# Patient Record
Sex: Male | Born: 1950 | Race: White | Hispanic: No | Marital: Married | Smoking: Former smoker
Health system: Southern US, Community
[De-identification: ages and names within clinical notes are randomized; demographics above are authoritative.]

## PROBLEM LIST (undated history)

## (undated) DIAGNOSIS — J449 Chronic obstructive pulmonary disease, unspecified: Secondary | ICD-10-CM

## (undated) DIAGNOSIS — L409 Psoriasis, unspecified: Secondary | ICD-10-CM

## (undated) DIAGNOSIS — I509 Heart failure, unspecified: Secondary | ICD-10-CM

## (undated) HISTORY — PX: HERNIA REPAIR: SHX51

## (undated) HISTORY — PX: PACEMAKER IMPLANT: EP1218

## (undated) HISTORY — PX: EYE SURGERY: SHX253

---

## 2012-01-28 DIAGNOSIS — I428 Other cardiomyopathies: Secondary | ICD-10-CM | POA: Diagnosis not present

## 2012-01-28 DIAGNOSIS — Z95810 Presence of automatic (implantable) cardiac defibrillator: Secondary | ICD-10-CM | POA: Diagnosis not present

## 2012-04-06 DIAGNOSIS — M25569 Pain in unspecified knee: Secondary | ICD-10-CM | POA: Diagnosis not present

## 2012-04-06 DIAGNOSIS — M76899 Other specified enthesopathies of unspecified lower limb, excluding foot: Secondary | ICD-10-CM | POA: Diagnosis not present

## 2012-04-20 DIAGNOSIS — I509 Heart failure, unspecified: Secondary | ICD-10-CM | POA: Diagnosis not present

## 2012-04-20 DIAGNOSIS — I4729 Other ventricular tachycardia: Secondary | ICD-10-CM | POA: Diagnosis not present

## 2012-04-20 DIAGNOSIS — I428 Other cardiomyopathies: Secondary | ICD-10-CM | POA: Diagnosis not present

## 2012-04-20 DIAGNOSIS — I426 Alcoholic cardiomyopathy: Secondary | ICD-10-CM | POA: Diagnosis not present

## 2012-04-20 DIAGNOSIS — Z4502 Encounter for adjustment and management of automatic implantable cardiac defibrillator: Secondary | ICD-10-CM | POA: Diagnosis not present

## 2012-04-20 DIAGNOSIS — I472 Ventricular tachycardia, unspecified: Secondary | ICD-10-CM | POA: Diagnosis not present

## 2012-04-20 DIAGNOSIS — I442 Atrioventricular block, complete: Secondary | ICD-10-CM | POA: Diagnosis not present

## 2012-04-27 DIAGNOSIS — E785 Hyperlipidemia, unspecified: Secondary | ICD-10-CM | POA: Diagnosis not present

## 2012-04-27 DIAGNOSIS — M109 Gout, unspecified: Secondary | ICD-10-CM | POA: Diagnosis not present

## 2012-04-27 DIAGNOSIS — J449 Chronic obstructive pulmonary disease, unspecified: Secondary | ICD-10-CM | POA: Diagnosis not present

## 2012-04-27 DIAGNOSIS — I509 Heart failure, unspecified: Secondary | ICD-10-CM | POA: Diagnosis not present

## 2012-04-27 DIAGNOSIS — R7309 Other abnormal glucose: Secondary | ICD-10-CM | POA: Diagnosis not present

## 2012-04-27 DIAGNOSIS — I10 Essential (primary) hypertension: Secondary | ICD-10-CM | POA: Diagnosis not present

## 2012-04-27 DIAGNOSIS — Z125 Encounter for screening for malignant neoplasm of prostate: Secondary | ICD-10-CM | POA: Diagnosis not present

## 2012-05-11 DIAGNOSIS — J449 Chronic obstructive pulmonary disease, unspecified: Secondary | ICD-10-CM | POA: Diagnosis not present

## 2012-05-11 DIAGNOSIS — I426 Alcoholic cardiomyopathy: Secondary | ICD-10-CM | POA: Diagnosis not present

## 2012-05-11 DIAGNOSIS — R030 Elevated blood-pressure reading, without diagnosis of hypertension: Secondary | ICD-10-CM | POA: Diagnosis not present

## 2012-05-11 DIAGNOSIS — E782 Mixed hyperlipidemia: Secondary | ICD-10-CM | POA: Diagnosis not present

## 2012-05-11 DIAGNOSIS — M109 Gout, unspecified: Secondary | ICD-10-CM | POA: Diagnosis not present

## 2012-06-22 DIAGNOSIS — H9209 Otalgia, unspecified ear: Secondary | ICD-10-CM | POA: Diagnosis not present

## 2012-07-12 DIAGNOSIS — H6500 Acute serous otitis media, unspecified ear: Secondary | ICD-10-CM | POA: Diagnosis not present

## 2012-07-26 DIAGNOSIS — Z95810 Presence of automatic (implantable) cardiac defibrillator: Secondary | ICD-10-CM | POA: Diagnosis not present

## 2012-07-26 DIAGNOSIS — I428 Other cardiomyopathies: Secondary | ICD-10-CM | POA: Diagnosis not present

## 2012-10-20 DIAGNOSIS — M109 Gout, unspecified: Secondary | ICD-10-CM | POA: Diagnosis not present

## 2012-10-20 DIAGNOSIS — I472 Ventricular tachycardia: Secondary | ICD-10-CM | POA: Diagnosis not present

## 2012-10-20 DIAGNOSIS — N400 Enlarged prostate without lower urinary tract symptoms: Secondary | ICD-10-CM | POA: Diagnosis not present

## 2012-10-20 DIAGNOSIS — I509 Heart failure, unspecified: Secondary | ICD-10-CM | POA: Diagnosis not present

## 2012-10-20 DIAGNOSIS — R7309 Other abnormal glucose: Secondary | ICD-10-CM | POA: Diagnosis not present

## 2012-10-20 DIAGNOSIS — R7301 Impaired fasting glucose: Secondary | ICD-10-CM | POA: Diagnosis not present

## 2012-10-20 DIAGNOSIS — R030 Elevated blood-pressure reading, without diagnosis of hypertension: Secondary | ICD-10-CM | POA: Diagnosis not present

## 2012-10-20 DIAGNOSIS — E782 Mixed hyperlipidemia: Secondary | ICD-10-CM | POA: Diagnosis not present

## 2012-10-20 DIAGNOSIS — G4700 Insomnia, unspecified: Secondary | ICD-10-CM | POA: Diagnosis not present

## 2012-10-20 DIAGNOSIS — K5730 Diverticulosis of large intestine without perforation or abscess without bleeding: Secondary | ICD-10-CM | POA: Diagnosis not present

## 2012-10-20 DIAGNOSIS — I426 Alcoholic cardiomyopathy: Secondary | ICD-10-CM | POA: Diagnosis not present

## 2012-10-20 DIAGNOSIS — I442 Atrioventricular block, complete: Secondary | ICD-10-CM | POA: Diagnosis not present

## 2012-10-20 DIAGNOSIS — J449 Chronic obstructive pulmonary disease, unspecified: Secondary | ICD-10-CM | POA: Diagnosis not present

## 2012-11-08 DIAGNOSIS — I428 Other cardiomyopathies: Secondary | ICD-10-CM | POA: Diagnosis not present

## 2012-11-08 DIAGNOSIS — I442 Atrioventricular block, complete: Secondary | ICD-10-CM | POA: Diagnosis not present

## 2012-11-08 DIAGNOSIS — I472 Ventricular tachycardia: Secondary | ICD-10-CM | POA: Diagnosis not present

## 2012-11-08 DIAGNOSIS — Z95810 Presence of automatic (implantable) cardiac defibrillator: Secondary | ICD-10-CM | POA: Diagnosis not present

## 2012-11-08 DIAGNOSIS — I426 Alcoholic cardiomyopathy: Secondary | ICD-10-CM | POA: Diagnosis not present

## 2012-11-08 DIAGNOSIS — I509 Heart failure, unspecified: Secondary | ICD-10-CM | POA: Diagnosis not present

## 2012-11-08 DIAGNOSIS — Z4502 Encounter for adjustment and management of automatic implantable cardiac defibrillator: Secondary | ICD-10-CM | POA: Diagnosis not present

## 2013-02-07 DIAGNOSIS — I472 Ventricular tachycardia: Secondary | ICD-10-CM | POA: Diagnosis not present

## 2013-02-07 DIAGNOSIS — R059 Cough, unspecified: Secondary | ICD-10-CM | POA: Diagnosis not present

## 2013-02-07 DIAGNOSIS — J449 Chronic obstructive pulmonary disease, unspecified: Secondary | ICD-10-CM | POA: Diagnosis not present

## 2013-02-07 DIAGNOSIS — I442 Atrioventricular block, complete: Secondary | ICD-10-CM | POA: Diagnosis not present

## 2013-02-07 DIAGNOSIS — I428 Other cardiomyopathies: Secondary | ICD-10-CM | POA: Diagnosis not present

## 2013-02-07 DIAGNOSIS — R05 Cough: Secondary | ICD-10-CM | POA: Diagnosis not present

## 2013-02-07 DIAGNOSIS — Z95810 Presence of automatic (implantable) cardiac defibrillator: Secondary | ICD-10-CM | POA: Diagnosis not present

## 2013-02-07 DIAGNOSIS — J069 Acute upper respiratory infection, unspecified: Secondary | ICD-10-CM | POA: Diagnosis not present

## 2013-02-07 DIAGNOSIS — J9801 Acute bronchospasm: Secondary | ICD-10-CM | POA: Diagnosis not present

## 2013-04-26 DIAGNOSIS — K5730 Diverticulosis of large intestine without perforation or abscess without bleeding: Secondary | ICD-10-CM | POA: Diagnosis not present

## 2013-04-26 DIAGNOSIS — G4700 Insomnia, unspecified: Secondary | ICD-10-CM | POA: Diagnosis not present

## 2013-04-26 DIAGNOSIS — M109 Gout, unspecified: Secondary | ICD-10-CM | POA: Diagnosis not present

## 2013-04-26 DIAGNOSIS — I426 Alcoholic cardiomyopathy: Secondary | ICD-10-CM | POA: Diagnosis not present

## 2013-04-26 DIAGNOSIS — E782 Mixed hyperlipidemia: Secondary | ICD-10-CM | POA: Diagnosis not present

## 2013-04-26 DIAGNOSIS — N400 Enlarged prostate without lower urinary tract symptoms: Secondary | ICD-10-CM | POA: Diagnosis not present

## 2013-04-26 DIAGNOSIS — I10 Essential (primary) hypertension: Secondary | ICD-10-CM | POA: Diagnosis not present

## 2013-04-26 DIAGNOSIS — J449 Chronic obstructive pulmonary disease, unspecified: Secondary | ICD-10-CM | POA: Diagnosis not present

## 2013-04-26 DIAGNOSIS — Z23 Encounter for immunization: Secondary | ICD-10-CM | POA: Diagnosis not present

## 2013-04-26 DIAGNOSIS — Z79899 Other long term (current) drug therapy: Secondary | ICD-10-CM | POA: Diagnosis not present

## 2013-04-26 DIAGNOSIS — E785 Hyperlipidemia, unspecified: Secondary | ICD-10-CM | POA: Diagnosis not present

## 2013-04-26 DIAGNOSIS — R7309 Other abnormal glucose: Secondary | ICD-10-CM | POA: Diagnosis not present

## 2013-04-26 DIAGNOSIS — Z125 Encounter for screening for malignant neoplasm of prostate: Secondary | ICD-10-CM | POA: Diagnosis not present

## 2013-04-30 DIAGNOSIS — Z1212 Encounter for screening for malignant neoplasm of rectum: Secondary | ICD-10-CM | POA: Diagnosis not present

## 2013-05-24 DIAGNOSIS — Z95810 Presence of automatic (implantable) cardiac defibrillator: Secondary | ICD-10-CM | POA: Diagnosis not present

## 2013-05-24 DIAGNOSIS — I509 Heart failure, unspecified: Secondary | ICD-10-CM | POA: Diagnosis not present

## 2013-05-24 DIAGNOSIS — I442 Atrioventricular block, complete: Secondary | ICD-10-CM | POA: Diagnosis not present

## 2013-05-24 DIAGNOSIS — I472 Ventricular tachycardia: Secondary | ICD-10-CM | POA: Diagnosis not present

## 2013-05-24 DIAGNOSIS — Z4502 Encounter for adjustment and management of automatic implantable cardiac defibrillator: Secondary | ICD-10-CM | POA: Diagnosis not present

## 2013-05-24 DIAGNOSIS — I426 Alcoholic cardiomyopathy: Secondary | ICD-10-CM | POA: Diagnosis not present

## 2013-08-09 DIAGNOSIS — Z23 Encounter for immunization: Secondary | ICD-10-CM | POA: Diagnosis not present

## 2013-08-29 DIAGNOSIS — Z4502 Encounter for adjustment and management of automatic implantable cardiac defibrillator: Secondary | ICD-10-CM | POA: Diagnosis not present

## 2013-08-29 DIAGNOSIS — I442 Atrioventricular block, complete: Secondary | ICD-10-CM | POA: Diagnosis not present

## 2013-08-29 DIAGNOSIS — I472 Ventricular tachycardia: Secondary | ICD-10-CM | POA: Diagnosis not present

## 2013-08-29 DIAGNOSIS — Z95810 Presence of automatic (implantable) cardiac defibrillator: Secondary | ICD-10-CM | POA: Diagnosis not present

## 2013-08-29 DIAGNOSIS — I426 Alcoholic cardiomyopathy: Secondary | ICD-10-CM | POA: Diagnosis not present

## 2013-08-29 DIAGNOSIS — I428 Other cardiomyopathies: Secondary | ICD-10-CM | POA: Diagnosis not present

## 2013-08-29 DIAGNOSIS — I509 Heart failure, unspecified: Secondary | ICD-10-CM | POA: Diagnosis not present

## 2013-10-24 DIAGNOSIS — L408 Other psoriasis: Secondary | ICD-10-CM | POA: Diagnosis not present

## 2013-11-05 DIAGNOSIS — N400 Enlarged prostate without lower urinary tract symptoms: Secondary | ICD-10-CM | POA: Diagnosis not present

## 2013-11-05 DIAGNOSIS — J449 Chronic obstructive pulmonary disease, unspecified: Secondary | ICD-10-CM | POA: Diagnosis not present

## 2013-11-05 DIAGNOSIS — E785 Hyperlipidemia, unspecified: Secondary | ICD-10-CM | POA: Diagnosis not present

## 2013-11-05 DIAGNOSIS — R7309 Other abnormal glucose: Secondary | ICD-10-CM | POA: Diagnosis not present

## 2013-11-05 DIAGNOSIS — Z95810 Presence of automatic (implantable) cardiac defibrillator: Secondary | ICD-10-CM | POA: Diagnosis not present

## 2013-11-05 DIAGNOSIS — M109 Gout, unspecified: Secondary | ICD-10-CM | POA: Diagnosis not present

## 2013-11-05 DIAGNOSIS — I10 Essential (primary) hypertension: Secondary | ICD-10-CM | POA: Diagnosis not present

## 2013-11-05 DIAGNOSIS — K5730 Diverticulosis of large intestine without perforation or abscess without bleeding: Secondary | ICD-10-CM | POA: Diagnosis not present

## 2013-11-12 DIAGNOSIS — I426 Alcoholic cardiomyopathy: Secondary | ICD-10-CM | POA: Diagnosis not present

## 2013-11-12 DIAGNOSIS — I10 Essential (primary) hypertension: Secondary | ICD-10-CM | POA: Diagnosis not present

## 2013-11-12 DIAGNOSIS — I442 Atrioventricular block, complete: Secondary | ICD-10-CM | POA: Diagnosis not present

## 2013-11-12 DIAGNOSIS — Z4502 Encounter for adjustment and management of automatic implantable cardiac defibrillator: Secondary | ICD-10-CM | POA: Diagnosis not present

## 2013-11-12 DIAGNOSIS — I472 Ventricular tachycardia: Secondary | ICD-10-CM | POA: Diagnosis not present

## 2013-11-12 DIAGNOSIS — I509 Heart failure, unspecified: Secondary | ICD-10-CM | POA: Diagnosis not present

## 2013-11-12 DIAGNOSIS — I4729 Other ventricular tachycardia: Secondary | ICD-10-CM | POA: Diagnosis not present

## 2013-11-12 DIAGNOSIS — I428 Other cardiomyopathies: Secondary | ICD-10-CM | POA: Diagnosis not present

## 2013-11-12 DIAGNOSIS — Z95810 Presence of automatic (implantable) cardiac defibrillator: Secondary | ICD-10-CM | POA: Diagnosis not present

## 2013-12-13 DIAGNOSIS — J069 Acute upper respiratory infection, unspecified: Secondary | ICD-10-CM | POA: Diagnosis not present

## 2013-12-13 DIAGNOSIS — J449 Chronic obstructive pulmonary disease, unspecified: Secondary | ICD-10-CM | POA: Diagnosis not present

## 2013-12-13 DIAGNOSIS — J9801 Acute bronchospasm: Secondary | ICD-10-CM | POA: Diagnosis not present

## 2014-02-25 DIAGNOSIS — I472 Ventricular tachycardia: Secondary | ICD-10-CM | POA: Diagnosis not present

## 2014-02-25 DIAGNOSIS — I428 Other cardiomyopathies: Secondary | ICD-10-CM | POA: Diagnosis not present

## 2014-02-25 DIAGNOSIS — I442 Atrioventricular block, complete: Secondary | ICD-10-CM | POA: Diagnosis not present

## 2014-02-25 DIAGNOSIS — I4729 Other ventricular tachycardia: Secondary | ICD-10-CM | POA: Diagnosis not present

## 2014-02-25 DIAGNOSIS — I509 Heart failure, unspecified: Secondary | ICD-10-CM | POA: Diagnosis not present

## 2014-02-25 DIAGNOSIS — Z95810 Presence of automatic (implantable) cardiac defibrillator: Secondary | ICD-10-CM | POA: Diagnosis not present

## 2014-05-10 DIAGNOSIS — E785 Hyperlipidemia, unspecified: Secondary | ICD-10-CM | POA: Diagnosis not present

## 2014-05-10 DIAGNOSIS — I472 Ventricular tachycardia: Secondary | ICD-10-CM | POA: Diagnosis not present

## 2014-05-10 DIAGNOSIS — I4729 Other ventricular tachycardia: Secondary | ICD-10-CM | POA: Diagnosis not present

## 2014-05-10 DIAGNOSIS — M109 Gout, unspecified: Secondary | ICD-10-CM | POA: Diagnosis not present

## 2014-05-10 DIAGNOSIS — N400 Enlarged prostate without lower urinary tract symptoms: Secondary | ICD-10-CM | POA: Diagnosis not present

## 2014-05-10 DIAGNOSIS — R7309 Other abnormal glucose: Secondary | ICD-10-CM | POA: Diagnosis not present

## 2014-05-10 DIAGNOSIS — G4700 Insomnia, unspecified: Secondary | ICD-10-CM | POA: Diagnosis not present

## 2014-05-10 DIAGNOSIS — I426 Alcoholic cardiomyopathy: Secondary | ICD-10-CM | POA: Diagnosis not present

## 2014-05-10 DIAGNOSIS — Z8639 Personal history of other endocrine, nutritional and metabolic disease: Secondary | ICD-10-CM | POA: Diagnosis not present

## 2014-05-10 DIAGNOSIS — R5381 Other malaise: Secondary | ICD-10-CM | POA: Diagnosis not present

## 2014-05-10 DIAGNOSIS — I442 Atrioventricular block, complete: Secondary | ICD-10-CM | POA: Diagnosis not present

## 2014-05-10 DIAGNOSIS — I509 Heart failure, unspecified: Secondary | ICD-10-CM | POA: Diagnosis not present

## 2014-05-10 DIAGNOSIS — I10 Essential (primary) hypertension: Secondary | ICD-10-CM | POA: Diagnosis not present

## 2014-05-10 DIAGNOSIS — K5730 Diverticulosis of large intestine without perforation or abscess without bleeding: Secondary | ICD-10-CM | POA: Diagnosis not present

## 2014-05-10 DIAGNOSIS — Z862 Personal history of diseases of the blood and blood-forming organs and certain disorders involving the immune mechanism: Secondary | ICD-10-CM | POA: Diagnosis not present

## 2014-05-13 DIAGNOSIS — Z0000 Encounter for general adult medical examination without abnormal findings: Secondary | ICD-10-CM | POA: Diagnosis not present

## 2014-05-13 DIAGNOSIS — R5383 Other fatigue: Secondary | ICD-10-CM | POA: Diagnosis not present

## 2014-05-13 DIAGNOSIS — N400 Enlarged prostate without lower urinary tract symptoms: Secondary | ICD-10-CM | POA: Diagnosis not present

## 2014-05-13 DIAGNOSIS — R5381 Other malaise: Secondary | ICD-10-CM | POA: Diagnosis not present

## 2014-05-13 DIAGNOSIS — R7309 Other abnormal glucose: Secondary | ICD-10-CM | POA: Diagnosis not present

## 2014-05-13 DIAGNOSIS — E785 Hyperlipidemia, unspecified: Secondary | ICD-10-CM | POA: Diagnosis not present

## 2014-05-13 DIAGNOSIS — I10 Essential (primary) hypertension: Secondary | ICD-10-CM | POA: Diagnosis not present

## 2014-05-13 DIAGNOSIS — Z862 Personal history of diseases of the blood and blood-forming organs and certain disorders involving the immune mechanism: Secondary | ICD-10-CM | POA: Diagnosis not present

## 2014-05-13 DIAGNOSIS — M109 Gout, unspecified: Secondary | ICD-10-CM | POA: Diagnosis not present

## 2014-05-24 DIAGNOSIS — I509 Heart failure, unspecified: Secondary | ICD-10-CM | POA: Diagnosis not present

## 2014-05-24 DIAGNOSIS — I428 Other cardiomyopathies: Secondary | ICD-10-CM | POA: Diagnosis not present

## 2014-05-24 DIAGNOSIS — Z95810 Presence of automatic (implantable) cardiac defibrillator: Secondary | ICD-10-CM | POA: Diagnosis not present

## 2014-09-16 DIAGNOSIS — M79601 Pain in right arm: Secondary | ICD-10-CM | POA: Diagnosis not present

## 2014-09-16 DIAGNOSIS — I10 Essential (primary) hypertension: Secondary | ICD-10-CM | POA: Diagnosis not present

## 2014-09-16 DIAGNOSIS — G479 Sleep disorder, unspecified: Secondary | ICD-10-CM | POA: Diagnosis not present

## 2014-09-16 DIAGNOSIS — R51 Headache: Secondary | ICD-10-CM | POA: Diagnosis not present

## 2014-10-02 DIAGNOSIS — J329 Chronic sinusitis, unspecified: Secondary | ICD-10-CM | POA: Diagnosis not present

## 2014-10-02 DIAGNOSIS — J40 Bronchitis, not specified as acute or chronic: Secondary | ICD-10-CM | POA: Diagnosis not present

## 2014-10-02 DIAGNOSIS — Z95810 Presence of automatic (implantable) cardiac defibrillator: Secondary | ICD-10-CM | POA: Diagnosis not present

## 2014-10-02 DIAGNOSIS — R05 Cough: Secondary | ICD-10-CM | POA: Diagnosis not present

## 2014-10-17 DIAGNOSIS — I472 Ventricular tachycardia: Secondary | ICD-10-CM | POA: Diagnosis not present

## 2014-10-17 DIAGNOSIS — I10 Essential (primary) hypertension: Secondary | ICD-10-CM | POA: Diagnosis not present

## 2014-10-17 DIAGNOSIS — Z95810 Presence of automatic (implantable) cardiac defibrillator: Secondary | ICD-10-CM | POA: Diagnosis not present

## 2014-10-17 DIAGNOSIS — R0789 Other chest pain: Secondary | ICD-10-CM | POA: Diagnosis not present

## 2014-10-17 DIAGNOSIS — I442 Atrioventricular block, complete: Secondary | ICD-10-CM | POA: Diagnosis not present

## 2014-10-17 DIAGNOSIS — Z4502 Encounter for adjustment and management of automatic implantable cardiac defibrillator: Secondary | ICD-10-CM | POA: Diagnosis not present

## 2014-10-17 DIAGNOSIS — I426 Alcoholic cardiomyopathy: Secondary | ICD-10-CM | POA: Diagnosis not present

## 2014-10-21 DIAGNOSIS — R0789 Other chest pain: Secondary | ICD-10-CM | POA: Diagnosis not present

## 2014-10-29 DIAGNOSIS — J329 Chronic sinusitis, unspecified: Secondary | ICD-10-CM | POA: Diagnosis not present

## 2014-11-08 DIAGNOSIS — I10 Essential (primary) hypertension: Secondary | ICD-10-CM | POA: Diagnosis not present

## 2014-11-08 DIAGNOSIS — R7309 Other abnormal glucose: Secondary | ICD-10-CM | POA: Diagnosis not present

## 2014-11-08 DIAGNOSIS — E785 Hyperlipidemia, unspecified: Secondary | ICD-10-CM | POA: Diagnosis not present

## 2014-11-08 DIAGNOSIS — Z8739 Personal history of other diseases of the musculoskeletal system and connective tissue: Secondary | ICD-10-CM | POA: Diagnosis not present

## 2014-11-14 DIAGNOSIS — I509 Heart failure, unspecified: Secondary | ICD-10-CM | POA: Diagnosis not present

## 2014-11-14 DIAGNOSIS — I442 Atrioventricular block, complete: Secondary | ICD-10-CM | POA: Diagnosis not present

## 2014-11-14 DIAGNOSIS — I426 Alcoholic cardiomyopathy: Secondary | ICD-10-CM | POA: Diagnosis not present

## 2014-11-14 DIAGNOSIS — Z95810 Presence of automatic (implantable) cardiac defibrillator: Secondary | ICD-10-CM | POA: Diagnosis not present

## 2014-11-14 DIAGNOSIS — I10 Essential (primary) hypertension: Secondary | ICD-10-CM | POA: Diagnosis not present

## 2015-01-22 DIAGNOSIS — I509 Heart failure, unspecified: Secondary | ICD-10-CM | POA: Diagnosis not present

## 2015-01-22 DIAGNOSIS — Z95810 Presence of automatic (implantable) cardiac defibrillator: Secondary | ICD-10-CM | POA: Diagnosis not present

## 2015-01-22 DIAGNOSIS — I442 Atrioventricular block, complete: Secondary | ICD-10-CM | POA: Diagnosis not present

## 2015-01-22 DIAGNOSIS — I429 Cardiomyopathy, unspecified: Secondary | ICD-10-CM | POA: Diagnosis not present

## 2015-04-23 DIAGNOSIS — I429 Cardiomyopathy, unspecified: Secondary | ICD-10-CM | POA: Diagnosis not present

## 2015-04-23 DIAGNOSIS — Z95810 Presence of automatic (implantable) cardiac defibrillator: Secondary | ICD-10-CM | POA: Diagnosis not present

## 2015-05-23 DIAGNOSIS — Z125 Encounter for screening for malignant neoplasm of prostate: Secondary | ICD-10-CM | POA: Diagnosis not present

## 2015-05-23 DIAGNOSIS — E785 Hyperlipidemia, unspecified: Secondary | ICD-10-CM | POA: Diagnosis not present

## 2015-05-23 DIAGNOSIS — I10 Essential (primary) hypertension: Secondary | ICD-10-CM | POA: Diagnosis not present

## 2015-05-23 DIAGNOSIS — R5383 Other fatigue: Secondary | ICD-10-CM | POA: Diagnosis not present

## 2015-05-23 DIAGNOSIS — J449 Chronic obstructive pulmonary disease, unspecified: Secondary | ICD-10-CM | POA: Diagnosis not present

## 2015-05-23 DIAGNOSIS — M109 Gout, unspecified: Secondary | ICD-10-CM | POA: Diagnosis not present

## 2015-05-23 DIAGNOSIS — R739 Hyperglycemia, unspecified: Secondary | ICD-10-CM | POA: Diagnosis not present

## 2015-05-23 DIAGNOSIS — Z23 Encounter for immunization: Secondary | ICD-10-CM | POA: Diagnosis not present

## 2015-05-26 DIAGNOSIS — Z1211 Encounter for screening for malignant neoplasm of colon: Secondary | ICD-10-CM | POA: Diagnosis not present

## 2015-06-05 DIAGNOSIS — Z95810 Presence of automatic (implantable) cardiac defibrillator: Secondary | ICD-10-CM | POA: Diagnosis not present

## 2015-06-05 DIAGNOSIS — I472 Ventricular tachycardia: Secondary | ICD-10-CM | POA: Diagnosis not present

## 2015-06-05 DIAGNOSIS — I429 Cardiomyopathy, unspecified: Secondary | ICD-10-CM | POA: Diagnosis not present

## 2015-06-05 DIAGNOSIS — I442 Atrioventricular block, complete: Secondary | ICD-10-CM | POA: Diagnosis not present

## 2015-06-05 DIAGNOSIS — I426 Alcoholic cardiomyopathy: Secondary | ICD-10-CM | POA: Diagnosis not present

## 2015-06-05 DIAGNOSIS — I10 Essential (primary) hypertension: Secondary | ICD-10-CM | POA: Diagnosis not present

## 2015-06-05 DIAGNOSIS — Z4502 Encounter for adjustment and management of automatic implantable cardiac defibrillator: Secondary | ICD-10-CM | POA: Diagnosis not present

## 2015-09-10 DIAGNOSIS — Z95810 Presence of automatic (implantable) cardiac defibrillator: Secondary | ICD-10-CM | POA: Diagnosis not present

## 2015-09-10 DIAGNOSIS — I429 Cardiomyopathy, unspecified: Secondary | ICD-10-CM | POA: Diagnosis not present

## 2015-11-19 DIAGNOSIS — J449 Chronic obstructive pulmonary disease, unspecified: Secondary | ICD-10-CM | POA: Diagnosis not present

## 2015-11-19 DIAGNOSIS — M109 Gout, unspecified: Secondary | ICD-10-CM | POA: Diagnosis not present

## 2015-11-19 DIAGNOSIS — M1009 Idiopathic gout, multiple sites: Secondary | ICD-10-CM | POA: Diagnosis not present

## 2015-11-19 DIAGNOSIS — I10 Essential (primary) hypertension: Secondary | ICD-10-CM | POA: Diagnosis not present

## 2015-11-19 DIAGNOSIS — E785 Hyperlipidemia, unspecified: Secondary | ICD-10-CM | POA: Diagnosis not present

## 2015-11-19 DIAGNOSIS — E7800 Pure hypercholesterolemia, unspecified: Secondary | ICD-10-CM | POA: Diagnosis not present

## 2016-01-23 DIAGNOSIS — I429 Cardiomyopathy, unspecified: Secondary | ICD-10-CM | POA: Diagnosis not present

## 2016-01-23 DIAGNOSIS — I442 Atrioventricular block, complete: Secondary | ICD-10-CM | POA: Diagnosis not present

## 2016-01-23 DIAGNOSIS — I472 Ventricular tachycardia: Secondary | ICD-10-CM | POA: Diagnosis not present

## 2016-01-23 DIAGNOSIS — I10 Essential (primary) hypertension: Secondary | ICD-10-CM | POA: Diagnosis not present

## 2016-01-23 DIAGNOSIS — Z95810 Presence of automatic (implantable) cardiac defibrillator: Secondary | ICD-10-CM | POA: Diagnosis not present

## 2016-01-23 DIAGNOSIS — Z4502 Encounter for adjustment and management of automatic implantable cardiac defibrillator: Secondary | ICD-10-CM | POA: Diagnosis not present

## 2016-01-23 DIAGNOSIS — I426 Alcoholic cardiomyopathy: Secondary | ICD-10-CM | POA: Diagnosis not present

## 2016-04-28 DIAGNOSIS — Z95810 Presence of automatic (implantable) cardiac defibrillator: Secondary | ICD-10-CM | POA: Diagnosis not present

## 2016-04-28 DIAGNOSIS — I429 Cardiomyopathy, unspecified: Secondary | ICD-10-CM | POA: Diagnosis not present

## 2016-04-28 DIAGNOSIS — I442 Atrioventricular block, complete: Secondary | ICD-10-CM | POA: Diagnosis not present

## 2016-05-20 DIAGNOSIS — R7309 Other abnormal glucose: Secondary | ICD-10-CM | POA: Diagnosis not present

## 2016-05-20 DIAGNOSIS — K5730 Diverticulosis of large intestine without perforation or abscess without bleeding: Secondary | ICD-10-CM | POA: Diagnosis not present

## 2016-05-20 DIAGNOSIS — G479 Sleep disorder, unspecified: Secondary | ICD-10-CM | POA: Diagnosis not present

## 2016-05-20 DIAGNOSIS — I10 Essential (primary) hypertension: Secondary | ICD-10-CM | POA: Diagnosis not present

## 2016-05-20 DIAGNOSIS — E7800 Pure hypercholesterolemia, unspecified: Secondary | ICD-10-CM | POA: Diagnosis not present

## 2016-05-20 DIAGNOSIS — N401 Enlarged prostate with lower urinary tract symptoms: Secondary | ICD-10-CM | POA: Diagnosis not present

## 2016-05-20 DIAGNOSIS — M109 Gout, unspecified: Secondary | ICD-10-CM | POA: Diagnosis not present

## 2016-05-20 DIAGNOSIS — I426 Alcoholic cardiomyopathy: Secondary | ICD-10-CM | POA: Diagnosis not present

## 2016-05-20 DIAGNOSIS — J309 Allergic rhinitis, unspecified: Secondary | ICD-10-CM | POA: Diagnosis not present

## 2016-05-20 DIAGNOSIS — I509 Heart failure, unspecified: Secondary | ICD-10-CM | POA: Diagnosis not present

## 2016-05-20 DIAGNOSIS — J449 Chronic obstructive pulmonary disease, unspecified: Secondary | ICD-10-CM | POA: Diagnosis not present

## 2016-05-20 DIAGNOSIS — Z95810 Presence of automatic (implantable) cardiac defibrillator: Secondary | ICD-10-CM | POA: Diagnosis not present

## 2016-05-20 DIAGNOSIS — Z0000 Encounter for general adult medical examination without abnormal findings: Secondary | ICD-10-CM | POA: Diagnosis not present

## 2016-05-20 DIAGNOSIS — E785 Hyperlipidemia, unspecified: Secondary | ICD-10-CM | POA: Diagnosis not present

## 2016-08-02 DIAGNOSIS — Z95810 Presence of automatic (implantable) cardiac defibrillator: Secondary | ICD-10-CM | POA: Diagnosis not present

## 2016-08-02 DIAGNOSIS — I472 Ventricular tachycardia: Secondary | ICD-10-CM | POA: Diagnosis not present

## 2016-08-06 DIAGNOSIS — J069 Acute upper respiratory infection, unspecified: Secondary | ICD-10-CM | POA: Diagnosis not present

## 2016-08-06 DIAGNOSIS — J9801 Acute bronchospasm: Secondary | ICD-10-CM | POA: Diagnosis not present

## 2016-08-06 DIAGNOSIS — J449 Chronic obstructive pulmonary disease, unspecified: Secondary | ICD-10-CM | POA: Diagnosis not present

## 2016-11-04 DIAGNOSIS — I471 Supraventricular tachycardia: Secondary | ICD-10-CM | POA: Diagnosis not present

## 2016-11-04 DIAGNOSIS — Z4502 Encounter for adjustment and management of automatic implantable cardiac defibrillator: Secondary | ICD-10-CM | POA: Diagnosis not present

## 2016-11-04 DIAGNOSIS — I472 Ventricular tachycardia: Secondary | ICD-10-CM | POA: Diagnosis not present

## 2016-12-01 DIAGNOSIS — Z881 Allergy status to other antibiotic agents status: Secondary | ICD-10-CM | POA: Diagnosis not present

## 2016-12-01 DIAGNOSIS — Z87891 Personal history of nicotine dependence: Secondary | ICD-10-CM | POA: Diagnosis not present

## 2016-12-01 DIAGNOSIS — R000 Tachycardia, unspecified: Secondary | ICD-10-CM | POA: Diagnosis not present

## 2016-12-01 DIAGNOSIS — J09X1 Influenza due to identified novel influenza A virus with pneumonia: Secondary | ICD-10-CM | POA: Diagnosis not present

## 2016-12-01 DIAGNOSIS — J101 Influenza due to other identified influenza virus with other respiratory manifestations: Secondary | ICD-10-CM | POA: Diagnosis not present

## 2016-12-01 DIAGNOSIS — R0602 Shortness of breath: Secondary | ICD-10-CM | POA: Diagnosis not present

## 2016-12-01 DIAGNOSIS — R51 Headache: Secondary | ICD-10-CM | POA: Diagnosis not present

## 2016-12-01 DIAGNOSIS — Z79899 Other long term (current) drug therapy: Secondary | ICD-10-CM | POA: Diagnosis not present

## 2016-12-01 DIAGNOSIS — K7460 Unspecified cirrhosis of liver: Secondary | ICD-10-CM | POA: Diagnosis not present

## 2016-12-01 DIAGNOSIS — I252 Old myocardial infarction: Secondary | ICD-10-CM | POA: Diagnosis not present

## 2016-12-01 DIAGNOSIS — R509 Fever, unspecified: Secondary | ICD-10-CM | POA: Diagnosis not present

## 2016-12-01 DIAGNOSIS — J189 Pneumonia, unspecified organism: Secondary | ICD-10-CM | POA: Diagnosis not present

## 2016-12-01 DIAGNOSIS — R918 Other nonspecific abnormal finding of lung field: Secondary | ICD-10-CM | POA: Diagnosis not present

## 2016-12-10 DIAGNOSIS — I10 Essential (primary) hypertension: Secondary | ICD-10-CM | POA: Diagnosis not present

## 2016-12-10 DIAGNOSIS — J449 Chronic obstructive pulmonary disease, unspecified: Secondary | ICD-10-CM | POA: Diagnosis not present

## 2016-12-10 DIAGNOSIS — E785 Hyperlipidemia, unspecified: Secondary | ICD-10-CM | POA: Diagnosis not present

## 2016-12-10 DIAGNOSIS — M109 Gout, unspecified: Secondary | ICD-10-CM | POA: Diagnosis not present

## 2016-12-16 DIAGNOSIS — I10 Essential (primary) hypertension: Secondary | ICD-10-CM | POA: Diagnosis not present

## 2016-12-16 DIAGNOSIS — M109 Gout, unspecified: Secondary | ICD-10-CM | POA: Diagnosis not present

## 2016-12-16 DIAGNOSIS — K5790 Diverticulosis of intestine, part unspecified, without perforation or abscess without bleeding: Secondary | ICD-10-CM | POA: Diagnosis not present

## 2016-12-16 DIAGNOSIS — E7800 Pure hypercholesterolemia, unspecified: Secondary | ICD-10-CM | POA: Diagnosis not present

## 2017-02-09 DIAGNOSIS — Z4502 Encounter for adjustment and management of automatic implantable cardiac defibrillator: Secondary | ICD-10-CM | POA: Diagnosis not present

## 2017-02-16 DIAGNOSIS — L400 Psoriasis vulgaris: Secondary | ICD-10-CM | POA: Diagnosis not present

## 2017-04-27 ENCOUNTER — Ambulatory Visit (INDEPENDENT_AMBULATORY_CARE_PROVIDER_SITE_OTHER): Payer: Medicare Other

## 2017-04-27 ENCOUNTER — Other Ambulatory Visit: Payer: Self-pay | Admitting: Unknown Physician Specialty

## 2017-04-27 DIAGNOSIS — M7989 Other specified soft tissue disorders: Secondary | ICD-10-CM | POA: Diagnosis not present

## 2017-04-27 DIAGNOSIS — M79671 Pain in right foot: Secondary | ICD-10-CM | POA: Diagnosis not present

## 2017-04-28 DIAGNOSIS — M109 Gout, unspecified: Secondary | ICD-10-CM | POA: Diagnosis not present

## 2017-04-28 DIAGNOSIS — M79671 Pain in right foot: Secondary | ICD-10-CM | POA: Diagnosis not present

## 2017-06-03 DIAGNOSIS — I10 Essential (primary) hypertension: Secondary | ICD-10-CM | POA: Diagnosis not present

## 2017-06-03 DIAGNOSIS — Z0000 Encounter for general adult medical examination without abnormal findings: Secondary | ICD-10-CM | POA: Diagnosis not present

## 2017-06-03 DIAGNOSIS — Z125 Encounter for screening for malignant neoplasm of prostate: Secondary | ICD-10-CM | POA: Diagnosis not present

## 2017-06-03 DIAGNOSIS — E785 Hyperlipidemia, unspecified: Secondary | ICD-10-CM | POA: Diagnosis not present

## 2017-06-03 DIAGNOSIS — M109 Gout, unspecified: Secondary | ICD-10-CM | POA: Diagnosis not present

## 2017-06-03 DIAGNOSIS — R5383 Other fatigue: Secondary | ICD-10-CM | POA: Diagnosis not present

## 2017-06-06 DIAGNOSIS — Z1211 Encounter for screening for malignant neoplasm of colon: Secondary | ICD-10-CM | POA: Diagnosis not present

## 2017-08-22 DIAGNOSIS — Z95810 Presence of automatic (implantable) cardiac defibrillator: Secondary | ICD-10-CM | POA: Diagnosis not present

## 2017-08-22 DIAGNOSIS — I10 Essential (primary) hypertension: Secondary | ICD-10-CM | POA: Diagnosis not present

## 2017-08-22 DIAGNOSIS — I442 Atrioventricular block, complete: Secondary | ICD-10-CM | POA: Diagnosis not present

## 2017-08-22 DIAGNOSIS — I426 Alcoholic cardiomyopathy: Secondary | ICD-10-CM | POA: Diagnosis not present

## 2017-08-22 DIAGNOSIS — R0609 Other forms of dyspnea: Secondary | ICD-10-CM | POA: Diagnosis not present

## 2017-08-22 DIAGNOSIS — I429 Cardiomyopathy, unspecified: Secondary | ICD-10-CM | POA: Diagnosis not present

## 2017-08-22 DIAGNOSIS — Z4502 Encounter for adjustment and management of automatic implantable cardiac defibrillator: Secondary | ICD-10-CM | POA: Diagnosis not present

## 2017-08-22 DIAGNOSIS — I472 Ventricular tachycardia: Secondary | ICD-10-CM | POA: Diagnosis not present

## 2017-08-22 DIAGNOSIS — I509 Heart failure, unspecified: Secondary | ICD-10-CM | POA: Diagnosis not present

## 2017-08-31 DIAGNOSIS — I519 Heart disease, unspecified: Secondary | ICD-10-CM | POA: Diagnosis not present

## 2017-08-31 DIAGNOSIS — I5189 Other ill-defined heart diseases: Secondary | ICD-10-CM | POA: Diagnosis not present

## 2017-08-31 DIAGNOSIS — I517 Cardiomegaly: Secondary | ICD-10-CM | POA: Diagnosis not present

## 2017-08-31 DIAGNOSIS — Z95810 Presence of automatic (implantable) cardiac defibrillator: Secondary | ICD-10-CM | POA: Diagnosis not present

## 2017-08-31 DIAGNOSIS — I082 Rheumatic disorders of both aortic and tricuspid valves: Secondary | ICD-10-CM | POA: Diagnosis not present

## 2017-09-13 DIAGNOSIS — R0609 Other forms of dyspnea: Secondary | ICD-10-CM | POA: Diagnosis not present

## 2017-09-13 DIAGNOSIS — I472 Ventricular tachycardia: Secondary | ICD-10-CM | POA: Diagnosis not present

## 2017-09-13 DIAGNOSIS — I519 Heart disease, unspecified: Secondary | ICD-10-CM | POA: Diagnosis not present

## 2017-09-13 DIAGNOSIS — I5189 Other ill-defined heart diseases: Secondary | ICD-10-CM | POA: Diagnosis not present

## 2017-09-13 DIAGNOSIS — R9439 Abnormal result of other cardiovascular function study: Secondary | ICD-10-CM | POA: Diagnosis not present

## 2017-09-13 DIAGNOSIS — Z95810 Presence of automatic (implantable) cardiac defibrillator: Secondary | ICD-10-CM | POA: Diagnosis not present

## 2017-09-13 DIAGNOSIS — I426 Alcoholic cardiomyopathy: Secondary | ICD-10-CM | POA: Diagnosis not present

## 2017-09-13 DIAGNOSIS — I509 Heart failure, unspecified: Secondary | ICD-10-CM | POA: Diagnosis not present

## 2017-09-13 DIAGNOSIS — I442 Atrioventricular block, complete: Secondary | ICD-10-CM | POA: Diagnosis not present

## 2017-10-19 DIAGNOSIS — Z95810 Presence of automatic (implantable) cardiac defibrillator: Secondary | ICD-10-CM | POA: Diagnosis not present

## 2017-10-19 DIAGNOSIS — I509 Heart failure, unspecified: Secondary | ICD-10-CM | POA: Diagnosis not present

## 2017-10-19 DIAGNOSIS — J449 Chronic obstructive pulmonary disease, unspecified: Secondary | ICD-10-CM | POA: Diagnosis not present

## 2017-10-19 DIAGNOSIS — I10 Essential (primary) hypertension: Secondary | ICD-10-CM | POA: Diagnosis not present

## 2017-10-19 DIAGNOSIS — R0600 Dyspnea, unspecified: Secondary | ICD-10-CM | POA: Diagnosis not present

## 2017-10-28 DIAGNOSIS — R0600 Dyspnea, unspecified: Secondary | ICD-10-CM | POA: Diagnosis not present

## 2017-11-23 DIAGNOSIS — Z4502 Encounter for adjustment and management of automatic implantable cardiac defibrillator: Secondary | ICD-10-CM | POA: Diagnosis not present

## 2017-11-30 DIAGNOSIS — J449 Chronic obstructive pulmonary disease, unspecified: Secondary | ICD-10-CM | POA: Diagnosis not present

## 2017-11-30 DIAGNOSIS — R0609 Other forms of dyspnea: Secondary | ICD-10-CM | POA: Diagnosis not present

## 2017-11-30 DIAGNOSIS — Z95810 Presence of automatic (implantable) cardiac defibrillator: Secondary | ICD-10-CM | POA: Diagnosis not present

## 2017-11-30 DIAGNOSIS — I509 Heart failure, unspecified: Secondary | ICD-10-CM | POA: Diagnosis not present

## 2017-11-30 DIAGNOSIS — I10 Essential (primary) hypertension: Secondary | ICD-10-CM | POA: Diagnosis not present

## 2017-12-05 DIAGNOSIS — I10 Essential (primary) hypertension: Secondary | ICD-10-CM | POA: Diagnosis not present

## 2017-12-05 DIAGNOSIS — M109 Gout, unspecified: Secondary | ICD-10-CM | POA: Diagnosis not present

## 2017-12-05 DIAGNOSIS — J449 Chronic obstructive pulmonary disease, unspecified: Secondary | ICD-10-CM | POA: Diagnosis not present

## 2017-12-05 DIAGNOSIS — R739 Hyperglycemia, unspecified: Secondary | ICD-10-CM | POA: Diagnosis not present

## 2017-12-05 DIAGNOSIS — E785 Hyperlipidemia, unspecified: Secondary | ICD-10-CM | POA: Diagnosis not present

## 2018-02-28 DIAGNOSIS — I509 Heart failure, unspecified: Secondary | ICD-10-CM | POA: Diagnosis not present

## 2018-02-28 DIAGNOSIS — R0609 Other forms of dyspnea: Secondary | ICD-10-CM | POA: Diagnosis not present

## 2018-02-28 DIAGNOSIS — J449 Chronic obstructive pulmonary disease, unspecified: Secondary | ICD-10-CM | POA: Diagnosis not present

## 2018-03-01 DIAGNOSIS — Z4502 Encounter for adjustment and management of automatic implantable cardiac defibrillator: Secondary | ICD-10-CM | POA: Diagnosis not present

## 2018-03-06 IMAGING — DX DG FOOT COMPLETE 3+V*R*
3 series · 3 of 3 positions shown · non-contrast
Comparison: None.

CLINICAL DATA: Lateral pain and swelling for a month.  No trauma.

EXAM:
RIGHT FOOT COMPLETE - 3+ VIEW

[foot ap]
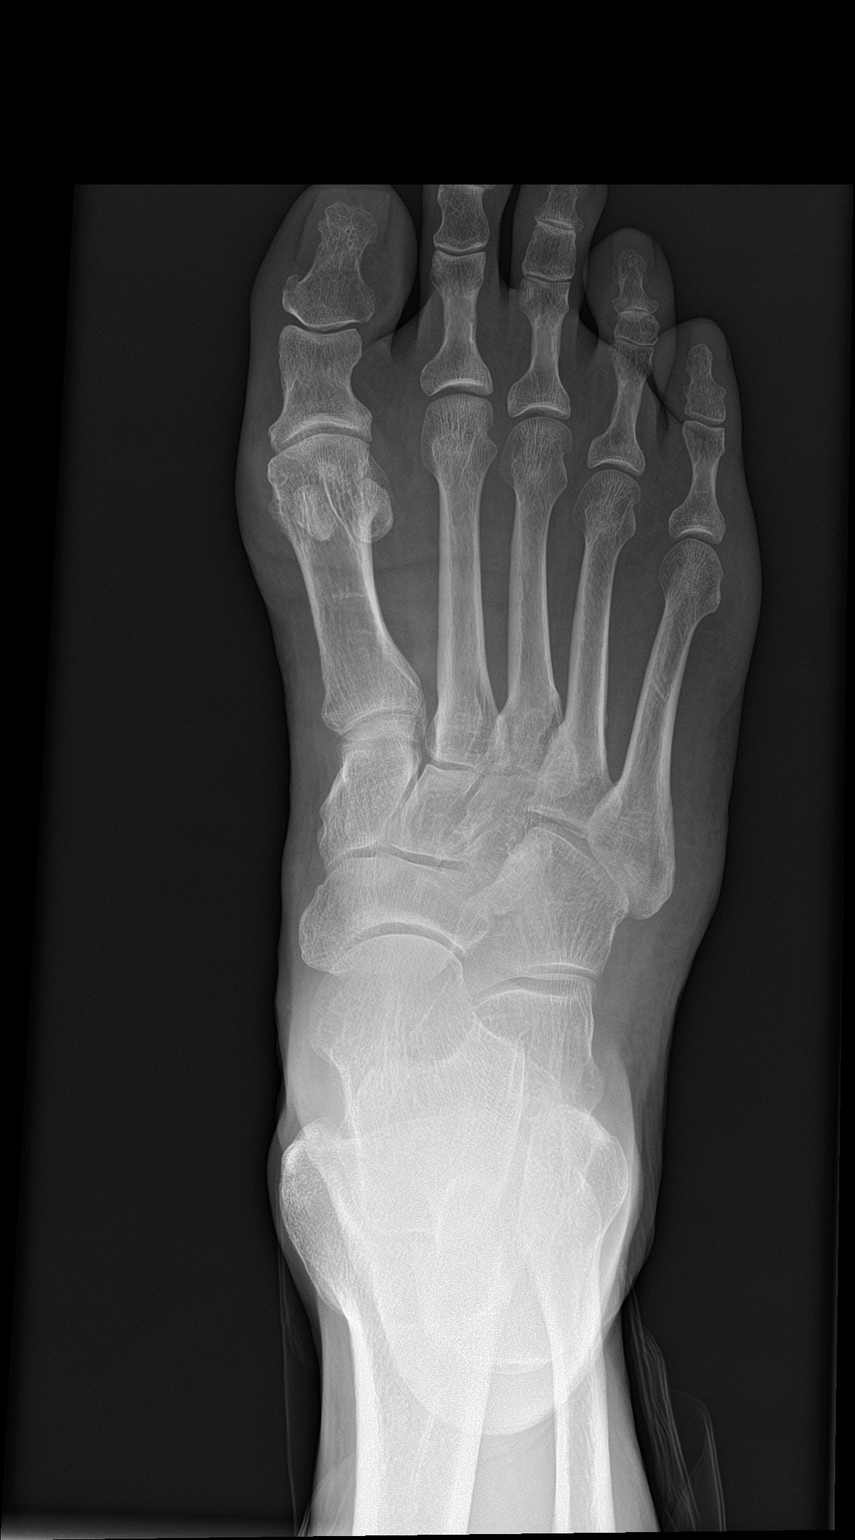

[foot obl]
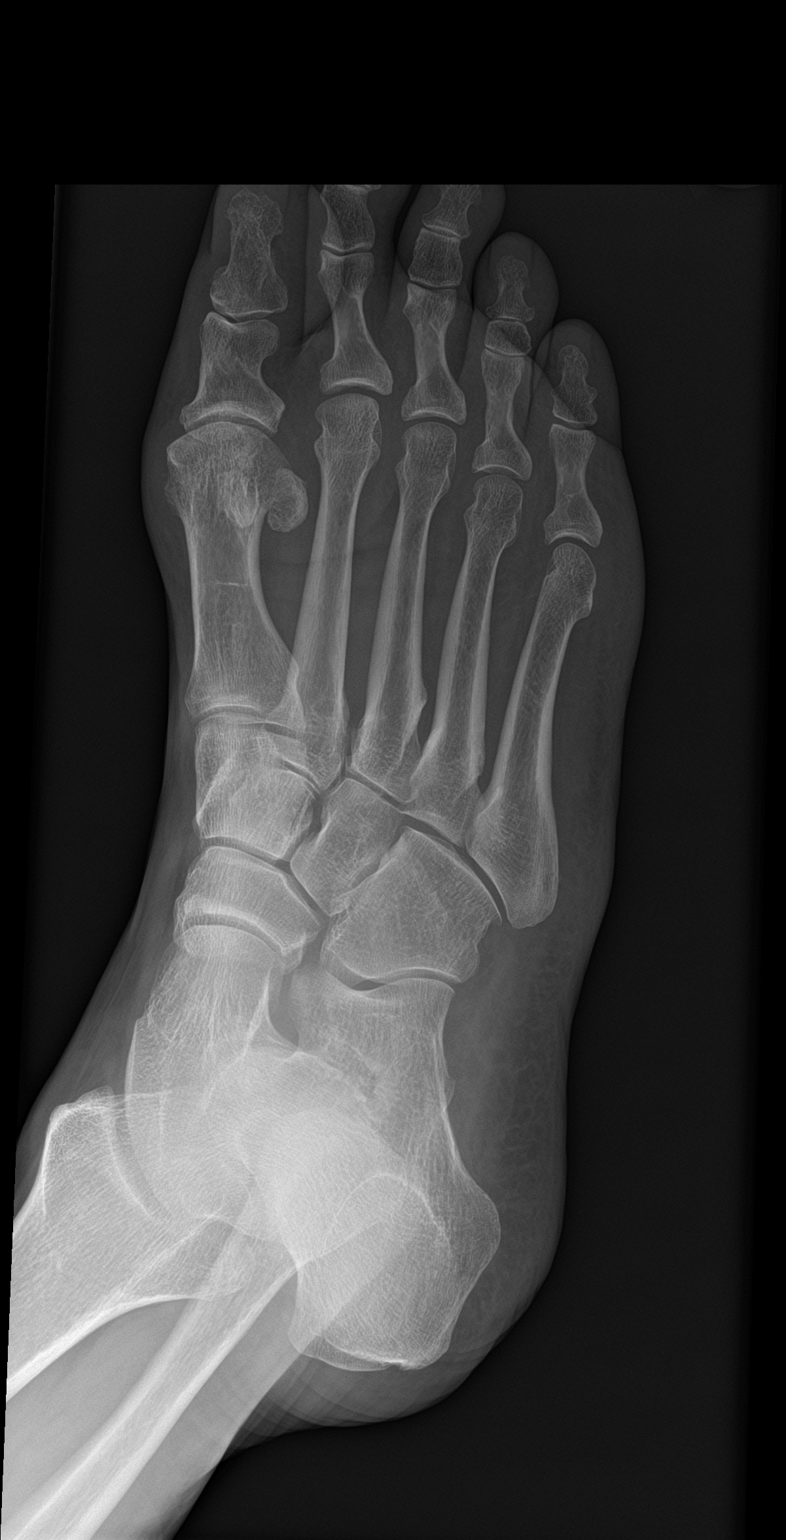

[foot lat]
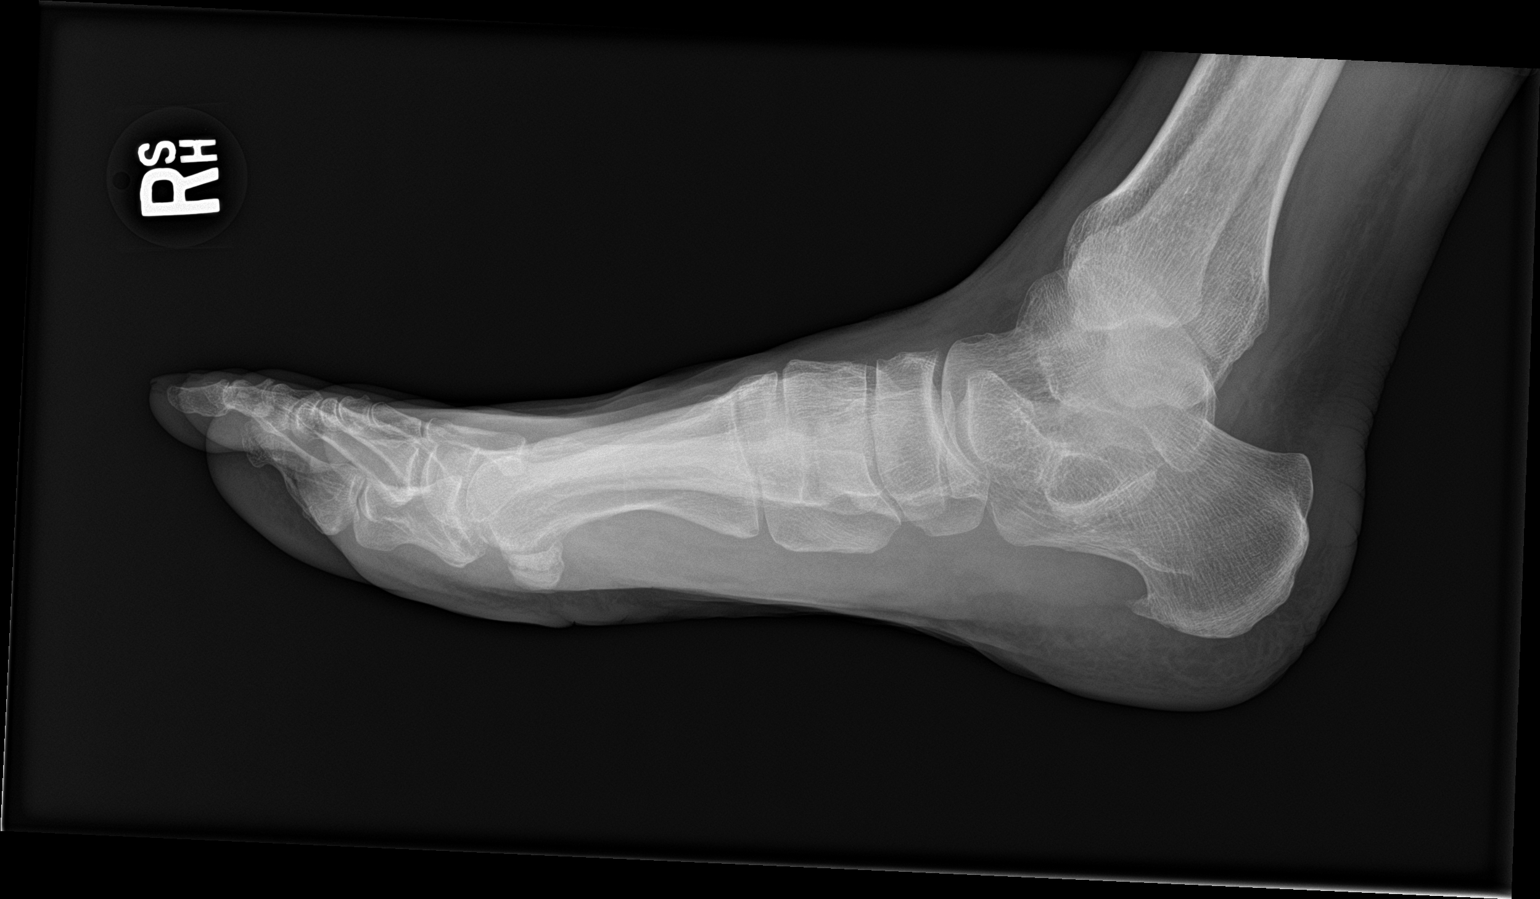

[3 of 3 positions shown; findings below may reference images not displayed]

FINDINGS: There is no evidence of fracture or dislocation. There is no
evidence of arthropathy or other focal bone abnormality. Soft
tissues are unremarkable.
IMPRESSION: Negative.

## 2018-05-31 DIAGNOSIS — Z4502 Encounter for adjustment and management of automatic implantable cardiac defibrillator: Secondary | ICD-10-CM | POA: Diagnosis not present

## 2018-06-02 ENCOUNTER — Other Ambulatory Visit: Payer: Self-pay

## 2018-06-05 DIAGNOSIS — L299 Pruritus, unspecified: Secondary | ICD-10-CM | POA: Diagnosis not present

## 2018-06-05 DIAGNOSIS — K130 Diseases of lips: Secondary | ICD-10-CM | POA: Diagnosis not present

## 2018-06-05 DIAGNOSIS — L409 Psoriasis, unspecified: Secondary | ICD-10-CM | POA: Diagnosis not present

## 2018-06-29 DIAGNOSIS — L408 Other psoriasis: Secondary | ICD-10-CM | POA: Diagnosis not present

## 2018-07-07 DIAGNOSIS — Z0000 Encounter for general adult medical examination without abnormal findings: Secondary | ICD-10-CM | POA: Diagnosis not present

## 2018-07-07 DIAGNOSIS — R5383 Other fatigue: Secondary | ICD-10-CM | POA: Diagnosis not present

## 2018-07-07 DIAGNOSIS — I10 Essential (primary) hypertension: Secondary | ICD-10-CM | POA: Diagnosis not present

## 2018-07-07 DIAGNOSIS — M109 Gout, unspecified: Secondary | ICD-10-CM | POA: Diagnosis not present

## 2018-07-07 DIAGNOSIS — E785 Hyperlipidemia, unspecified: Secondary | ICD-10-CM | POA: Diagnosis not present

## 2018-07-07 DIAGNOSIS — J449 Chronic obstructive pulmonary disease, unspecified: Secondary | ICD-10-CM | POA: Diagnosis not present

## 2018-07-07 DIAGNOSIS — Z125 Encounter for screening for malignant neoplasm of prostate: Secondary | ICD-10-CM | POA: Diagnosis not present

## 2018-07-07 DIAGNOSIS — Z23 Encounter for immunization: Secondary | ICD-10-CM | POA: Diagnosis not present

## 2018-07-07 DIAGNOSIS — N401 Enlarged prostate with lower urinary tract symptoms: Secondary | ICD-10-CM | POA: Diagnosis not present

## 2018-07-07 DIAGNOSIS — I509 Heart failure, unspecified: Secondary | ICD-10-CM | POA: Diagnosis not present

## 2018-07-14 DIAGNOSIS — Z1211 Encounter for screening for malignant neoplasm of colon: Secondary | ICD-10-CM | POA: Diagnosis not present

## 2018-07-15 ENCOUNTER — Other Ambulatory Visit: Payer: Self-pay

## 2018-07-15 ENCOUNTER — Encounter: Payer: Self-pay | Admitting: Emergency Medicine

## 2018-07-15 ENCOUNTER — Emergency Department (INDEPENDENT_AMBULATORY_CARE_PROVIDER_SITE_OTHER)
Admission: EM | Admit: 2018-07-15 | Discharge: 2018-07-15 | Disposition: A | Discharge status: Home / Self Care | Payer: Medicare Other | Source: Home / Self Care | Attending: Emergency Medicine | Admitting: Emergency Medicine

## 2018-07-15 DIAGNOSIS — R21 Rash and other nonspecific skin eruption: Secondary | ICD-10-CM

## 2018-07-15 DIAGNOSIS — L568 Other specified acute skin changes due to ultraviolet radiation: Secondary | ICD-10-CM | POA: Diagnosis not present

## 2018-07-15 HISTORY — DX: Chronic obstructive pulmonary disease, unspecified: J44.9

## 2018-07-15 HISTORY — DX: Psoriasis, unspecified: L40.9

## 2018-07-15 HISTORY — DX: Heart failure, unspecified: I50.9

## 2018-07-15 MED ORDER — PREDNISONE 10 MG (21) PO TBPK: ORAL_TABLET | ORAL | 0 refills | Status: AC

## 2018-07-15 NOTE — ED Provider Notes (Signed)
[name]    CSN: [id number] Arrival date & time: 07/15/18  1055     History   Chief Complaint Chief Complaint  Patient presents with  . Rash  patient was in his usual state of health until 2 weeks ago when after cutting a cedar tree he developed a rash on the sun exposed areas of both arms. He does have a y of psoriasis.  HPI Brent Mendez is a 67 y.o. male.   HPI  Past Medical History:  Diagnosis Date  . CHF (congestive heart failure) ([place])   . COPD (chronic obstructive pulmonary disease) ([place])   . Psoriasis     There are no active problems to display for this patient.   Past Surgical History:  Procedure Laterality Date  . EYE SURGERY    . HERNIA REPAIR    . PACEMAKER IMPLANT         Home Medications    Prior to Admission medications   Medication Sig Start Date End Date Taking? Authorizing Provider  ALLOPURINOL PO Take by mouth.   Yes [provider]  Calcipotriene-Betameth Diprop (ENSTILAR) 0.005-0.064 % FOAM Apply topically.   Yes [provider]  carvedilol (COREG) 25 MG tablet Take 25 mg by mouth 2 (two) times daily with a meal.   Yes [provider]  colchicine (COLCRYS) 0.6 MG tablet Take 0.6 mg by mouth daily.   Yes [provider]  Fluticasone-Salmeterol (ADVAIR) 250-50 MCG/DOSE AEPB Inhale 1 puff into the lungs 2 (two) times daily.   Yes [provider]  tiotropium (SPIRIVA) 18 MCG inhalation capsule Place 18 mcg into inhaler and inhale daily.   Yes [provider]  triamcinolone (KENALOG) 0.025 % ointment Apply 1 application topically 2 (two) times daily.   Yes [provider]  predniSONE (STERAPRED UNI-PAK 21 TAB) 10 MG (21) TBPK tablet Take 4 a day for 3 days, 3 a day for 3 days, 2 a day for 3 days, 1 a day for 3 days 07/15/18   Darlyne Russian, MD    Family History History reviewed. No pertinent family history.  Social History Social History   Tobacco Use  . Smoking  status: Former Research scientist (life sciences)  . Smokeless tobacco: Current User  Substance Use Topics  . Alcohol use: Never    Frequency: Never  . Drug use: Not on file     Allergies   Cephalosporins and Formaldehyde   Review of Systems Review of Systems  Cardiovascular:       History of V. Tach with an implanted defibrillator. No recent  changes     Physical Exam Triage Vital Signs ED Triage Vitals  Enc Vitals Group     BP 07/15/18 1147 106/68     Pulse Rate 07/15/18 1147 71     Resp 07/15/18 1147 18     Temp 07/15/18 1147 (!) 97.5 F (36.4 C)     Temp Source 07/15/18 1147 Oral     SpO2 07/15/18 1147 98 %     Weight 07/15/18 1148 178 lb (80.7 kg)     Height 07/15/18 1148 5\' 6"  (1.676 m)     Head Circumference --      Peak Flow --      Pain Score 07/15/18 1147 3     Pain Loc --      Pain Edu? --      Excl. in [place]? --    No data found.  Updated Vital Signs BP 106/68 (BP Location:  Right Arm)   Pulse 71   Temp (!) 97.5 F (36.4 C) (Oral)   Resp 18   Ht 5\' 6"  (1.676 m)   Wt 80.7 kg   SpO2 98%   BMI 28.73 kg/m   Visual Acuity Right Eye Distance:   Left Eye Distance:   Bilateral Distance:    Right Eye Near:   Left Eye Near:    Bilateral Near:     Physical Exam  Skin:  Macular papular rash involving the sun exposed areas of both arms     UC Treatments / Results  Labs (all labs ordered are listed, but only abnormal results are displayed) Labs Reviewed - No data to display  EKG None  Radiology No results found.  Procedures Procedures (including critical care time)  Medications Ordered in UC Medications - No data to display  Initial Impression / Assessment and Plan / UC Course  I have reviewed the triage vital signs and the nursing notes.  Pertinent labs & imaging results that were available during my care of the patient were reviewed by me and considered in my medical decision making (see chart for details). Patient has a photosensitivity rash involving the  upper extremities. No definite drug interactions with the medications he is currently on and the sun.Will treat with a prednisone taper and followup with his dermatologist     Final Clinical Impressions(s) / UC Diagnoses   Final diagnoses:  Rash  Photosensitivity dermatitis     Discharge Instructions     Use Neutrogena cream. Take prednisone taper as instructed. Follow-up with dermatologist.    ED Prescriptions    Medication Sig Dispense Auth. Provider   predniSONE (STERAPRED UNI-PAK 21 TAB) 10 MG (21) TBPK tablet Take 4 a day for 3 days, 3 a day for 3 days, 2 a day for 3 days, 1 a day for 3 days 30 tablet Darlyne Russian, MD     Controlled Substance Prescriptions [place] Controlled Substance Registry consulted? Not Applicable   Darlyne Russian, MD 07/15/18 1610

## 2018-07-15 NOTE — Discharge Instructions (Addendum)
Use Neutrogena cream. Take prednisone taper as instructed. Follow-up with dermatologist.

## 2018-07-15 NOTE — ED Triage Notes (Signed)
Patient has bright red rash on both arms below t-shirt sleeve covering; has gotten progressively worse since working in sun about 2 weeks ago; has not been able to sleep past 2 nights. He does have chronic psoriasis but cannot get in for dermatology evaluation for another 2 weeks. Has tried his triamcinolone cream without relief.

## 2018-08-11 DIAGNOSIS — Z01812 Encounter for preprocedural laboratory examination: Secondary | ICD-10-CM | POA: Diagnosis not present

## 2018-08-11 DIAGNOSIS — Z881 Allergy status to other antibiotic agents status: Secondary | ICD-10-CM | POA: Diagnosis not present

## 2018-08-11 DIAGNOSIS — Z79899 Other long term (current) drug therapy: Secondary | ICD-10-CM | POA: Diagnosis not present

## 2018-08-11 DIAGNOSIS — I426 Alcoholic cardiomyopathy: Secondary | ICD-10-CM | POA: Diagnosis not present

## 2018-08-11 DIAGNOSIS — I10 Essential (primary) hypertension: Secondary | ICD-10-CM | POA: Diagnosis not present

## 2018-08-11 DIAGNOSIS — J449 Chronic obstructive pulmonary disease, unspecified: Secondary | ICD-10-CM | POA: Diagnosis not present

## 2018-08-11 DIAGNOSIS — I471 Supraventricular tachycardia: Secondary | ICD-10-CM | POA: Diagnosis not present

## 2018-08-11 DIAGNOSIS — I472 Ventricular tachycardia: Secondary | ICD-10-CM | POA: Diagnosis not present

## 2018-08-11 DIAGNOSIS — Z4502 Encounter for adjustment and management of automatic implantable cardiac defibrillator: Secondary | ICD-10-CM | POA: Diagnosis not present

## 2018-08-14 DIAGNOSIS — I110 Hypertensive heart disease with heart failure: Secondary | ICD-10-CM | POA: Diagnosis not present

## 2018-08-14 DIAGNOSIS — J449 Chronic obstructive pulmonary disease, unspecified: Secondary | ICD-10-CM | POA: Diagnosis not present

## 2018-08-14 DIAGNOSIS — F17200 Nicotine dependence, unspecified, uncomplicated: Secondary | ICD-10-CM | POA: Diagnosis not present

## 2018-08-14 DIAGNOSIS — I442 Atrioventricular block, complete: Secondary | ICD-10-CM | POA: Diagnosis not present

## 2018-08-14 DIAGNOSIS — Z95810 Presence of automatic (implantable) cardiac defibrillator: Secondary | ICD-10-CM | POA: Diagnosis not present

## 2018-08-14 DIAGNOSIS — I509 Heart failure, unspecified: Secondary | ICD-10-CM | POA: Diagnosis not present

## 2018-08-14 DIAGNOSIS — E7849 Other hyperlipidemia: Secondary | ICD-10-CM | POA: Diagnosis not present

## 2018-08-14 DIAGNOSIS — I429 Cardiomyopathy, unspecified: Secondary | ICD-10-CM | POA: Diagnosis not present

## 2018-08-14 DIAGNOSIS — I472 Ventricular tachycardia: Secondary | ICD-10-CM | POA: Diagnosis not present

## 2018-08-14 DIAGNOSIS — I426 Alcoholic cardiomyopathy: Secondary | ICD-10-CM | POA: Diagnosis not present

## 2018-08-14 DIAGNOSIS — Z4502 Encounter for adjustment and management of automatic implantable cardiac defibrillator: Secondary | ICD-10-CM | POA: Diagnosis not present

## 2018-08-14 DIAGNOSIS — Z8674 Personal history of sudden cardiac arrest: Secondary | ICD-10-CM | POA: Diagnosis not present

## 2018-08-14 DIAGNOSIS — M1990 Unspecified osteoarthritis, unspecified site: Secondary | ICD-10-CM | POA: Diagnosis not present

## 2018-08-17 DIAGNOSIS — L408 Other psoriasis: Secondary | ICD-10-CM | POA: Diagnosis not present

## 2018-08-30 DIAGNOSIS — I509 Heart failure, unspecified: Secondary | ICD-10-CM | POA: Diagnosis not present

## 2018-08-30 DIAGNOSIS — R0609 Other forms of dyspnea: Secondary | ICD-10-CM | POA: Diagnosis not present

## 2018-08-30 DIAGNOSIS — J449 Chronic obstructive pulmonary disease, unspecified: Secondary | ICD-10-CM | POA: Diagnosis not present

## 2018-09-22 DIAGNOSIS — Z95810 Presence of automatic (implantable) cardiac defibrillator: Secondary | ICD-10-CM | POA: Diagnosis not present

## 2018-09-22 DIAGNOSIS — I442 Atrioventricular block, complete: Secondary | ICD-10-CM | POA: Diagnosis not present

## 2018-09-22 DIAGNOSIS — I429 Cardiomyopathy, unspecified: Secondary | ICD-10-CM | POA: Diagnosis not present

## 2018-10-17 DIAGNOSIS — L408 Other psoriasis: Secondary | ICD-10-CM | POA: Diagnosis not present

## 2018-11-15 DIAGNOSIS — E785 Hyperlipidemia, unspecified: Secondary | ICD-10-CM | POA: Diagnosis not present

## 2018-11-15 DIAGNOSIS — I10 Essential (primary) hypertension: Secondary | ICD-10-CM | POA: Diagnosis not present

## 2018-11-15 DIAGNOSIS — M109 Gout, unspecified: Secondary | ICD-10-CM | POA: Diagnosis not present

## 2018-11-15 DIAGNOSIS — J449 Chronic obstructive pulmonary disease, unspecified: Secondary | ICD-10-CM | POA: Diagnosis not present

## 2018-11-29 DIAGNOSIS — Z95810 Presence of automatic (implantable) cardiac defibrillator: Secondary | ICD-10-CM | POA: Diagnosis not present

## 2018-11-29 DIAGNOSIS — I429 Cardiomyopathy, unspecified: Secondary | ICD-10-CM | POA: Diagnosis not present

## 2018-11-29 DIAGNOSIS — I442 Atrioventricular block, complete: Secondary | ICD-10-CM | POA: Diagnosis not present

## 2018-11-29 DIAGNOSIS — I472 Ventricular tachycardia: Secondary | ICD-10-CM | POA: Diagnosis not present

## 2018-12-19 DIAGNOSIS — Z7951 Long term (current) use of inhaled steroids: Secondary | ICD-10-CM | POA: Diagnosis not present

## 2018-12-19 DIAGNOSIS — E785 Hyperlipidemia, unspecified: Secondary | ICD-10-CM | POA: Diagnosis not present

## 2018-12-19 DIAGNOSIS — I509 Heart failure, unspecified: Secondary | ICD-10-CM | POA: Diagnosis not present

## 2018-12-19 DIAGNOSIS — Z888 Allergy status to other drugs, medicaments and biological substances status: Secondary | ICD-10-CM | POA: Diagnosis not present

## 2018-12-19 DIAGNOSIS — Z95810 Presence of automatic (implantable) cardiac defibrillator: Secondary | ICD-10-CM | POA: Diagnosis not present

## 2018-12-19 DIAGNOSIS — K5730 Diverticulosis of large intestine without perforation or abscess without bleeding: Secondary | ICD-10-CM | POA: Diagnosis not present

## 2018-12-19 DIAGNOSIS — Z79899 Other long term (current) drug therapy: Secondary | ICD-10-CM | POA: Diagnosis not present

## 2018-12-19 DIAGNOSIS — J449 Chronic obstructive pulmonary disease, unspecified: Secondary | ICD-10-CM | POA: Diagnosis not present

## 2018-12-19 DIAGNOSIS — F17290 Nicotine dependence, other tobacco product, uncomplicated: Secondary | ICD-10-CM | POA: Diagnosis not present

## 2018-12-19 DIAGNOSIS — K635 Polyp of colon: Secondary | ICD-10-CM | POA: Diagnosis not present

## 2018-12-19 DIAGNOSIS — Z91048 Other nonmedicinal substance allergy status: Secondary | ICD-10-CM | POA: Diagnosis not present

## 2018-12-19 DIAGNOSIS — R195 Other fecal abnormalities: Secondary | ICD-10-CM | POA: Diagnosis not present

## 2018-12-19 DIAGNOSIS — Z881 Allergy status to other antibiotic agents status: Secondary | ICD-10-CM | POA: Diagnosis not present

## 2018-12-19 DIAGNOSIS — D123 Benign neoplasm of transverse colon: Secondary | ICD-10-CM | POA: Diagnosis not present

## 2018-12-19 DIAGNOSIS — K648 Other hemorrhoids: Secondary | ICD-10-CM | POA: Diagnosis not present

## 2018-12-27 DIAGNOSIS — I429 Cardiomyopathy, unspecified: Secondary | ICD-10-CM | POA: Diagnosis not present

## 2019-01-02 DIAGNOSIS — I472 Ventricular tachycardia: Secondary | ICD-10-CM | POA: Diagnosis not present

## 2019-01-02 DIAGNOSIS — Z95810 Presence of automatic (implantable) cardiac defibrillator: Secondary | ICD-10-CM | POA: Diagnosis not present

## 2019-01-02 DIAGNOSIS — I442 Atrioventricular block, complete: Secondary | ICD-10-CM | POA: Diagnosis not present

## 2019-01-02 DIAGNOSIS — Z4502 Encounter for adjustment and management of automatic implantable cardiac defibrillator: Secondary | ICD-10-CM | POA: Diagnosis not present

## 2019-01-08 DIAGNOSIS — E785 Hyperlipidemia, unspecified: Secondary | ICD-10-CM | POA: Diagnosis not present

## 2019-01-08 DIAGNOSIS — R739 Hyperglycemia, unspecified: Secondary | ICD-10-CM | POA: Diagnosis not present

## 2019-01-08 DIAGNOSIS — M109 Gout, unspecified: Secondary | ICD-10-CM | POA: Diagnosis not present

## 2019-01-08 DIAGNOSIS — I10 Essential (primary) hypertension: Secondary | ICD-10-CM | POA: Diagnosis not present

## 2019-01-08 DIAGNOSIS — J449 Chronic obstructive pulmonary disease, unspecified: Secondary | ICD-10-CM | POA: Diagnosis not present

## 2019-03-01 DIAGNOSIS — J449 Chronic obstructive pulmonary disease, unspecified: Secondary | ICD-10-CM | POA: Diagnosis not present

## 2019-03-01 DIAGNOSIS — J3089 Other allergic rhinitis: Secondary | ICD-10-CM | POA: Diagnosis not present

## 2019-03-29 DIAGNOSIS — L218 Other seborrheic dermatitis: Secondary | ICD-10-CM | POA: Diagnosis not present

## 2019-03-29 DIAGNOSIS — L408 Other psoriasis: Secondary | ICD-10-CM | POA: Diagnosis not present

## 2019-04-04 DIAGNOSIS — Z4502 Encounter for adjustment and management of automatic implantable cardiac defibrillator: Secondary | ICD-10-CM | POA: Diagnosis not present

## 2019-06-20 DIAGNOSIS — I442 Atrioventricular block, complete: Secondary | ICD-10-CM | POA: Diagnosis not present

## 2019-06-20 DIAGNOSIS — Z95810 Presence of automatic (implantable) cardiac defibrillator: Secondary | ICD-10-CM | POA: Diagnosis not present

## 2019-06-20 DIAGNOSIS — I429 Cardiomyopathy, unspecified: Secondary | ICD-10-CM | POA: Diagnosis not present

## 2019-06-20 DIAGNOSIS — I426 Alcoholic cardiomyopathy: Secondary | ICD-10-CM | POA: Diagnosis not present

## 2019-07-17 DIAGNOSIS — E785 Hyperlipidemia, unspecified: Secondary | ICD-10-CM | POA: Diagnosis not present

## 2019-07-17 DIAGNOSIS — I10 Essential (primary) hypertension: Secondary | ICD-10-CM | POA: Diagnosis not present

## 2019-07-17 DIAGNOSIS — J449 Chronic obstructive pulmonary disease, unspecified: Secondary | ICD-10-CM | POA: Diagnosis not present

## 2019-07-17 DIAGNOSIS — Z0000 Encounter for general adult medical examination without abnormal findings: Secondary | ICD-10-CM | POA: Diagnosis not present

## 2019-07-17 DIAGNOSIS — M109 Gout, unspecified: Secondary | ICD-10-CM | POA: Diagnosis not present

## 2019-07-30 DIAGNOSIS — M109 Gout, unspecified: Secondary | ICD-10-CM | POA: Diagnosis not present

## 2019-07-30 DIAGNOSIS — I10 Essential (primary) hypertension: Secondary | ICD-10-CM | POA: Diagnosis not present

## 2019-07-30 DIAGNOSIS — E785 Hyperlipidemia, unspecified: Secondary | ICD-10-CM | POA: Diagnosis not present

## 2019-07-30 DIAGNOSIS — R5383 Other fatigue: Secondary | ICD-10-CM | POA: Diagnosis not present

## 2019-07-30 DIAGNOSIS — J449 Chronic obstructive pulmonary disease, unspecified: Secondary | ICD-10-CM | POA: Diagnosis not present

## 2019-07-30 DIAGNOSIS — Z0000 Encounter for general adult medical examination without abnormal findings: Secondary | ICD-10-CM | POA: Diagnosis not present

## 2019-07-30 DIAGNOSIS — Z125 Encounter for screening for malignant neoplasm of prostate: Secondary | ICD-10-CM | POA: Diagnosis not present

## 2019-08-24 DIAGNOSIS — J449 Chronic obstructive pulmonary disease, unspecified: Secondary | ICD-10-CM | POA: Diagnosis not present

## 2019-08-24 DIAGNOSIS — R0602 Shortness of breath: Secondary | ICD-10-CM | POA: Diagnosis not present

## 2019-08-24 DIAGNOSIS — I509 Heart failure, unspecified: Secondary | ICD-10-CM | POA: Diagnosis not present

## 2019-08-24 DIAGNOSIS — J3089 Other allergic rhinitis: Secondary | ICD-10-CM | POA: Diagnosis not present

## 2019-09-11 DIAGNOSIS — Z8739 Personal history of other diseases of the musculoskeletal system and connective tissue: Secondary | ICD-10-CM | POA: Diagnosis not present

## 2019-09-11 DIAGNOSIS — M79672 Pain in left foot: Secondary | ICD-10-CM | POA: Diagnosis not present

## 2019-09-11 DIAGNOSIS — M109 Gout, unspecified: Secondary | ICD-10-CM | POA: Diagnosis not present

## 2019-09-19 DIAGNOSIS — I429 Cardiomyopathy, unspecified: Secondary | ICD-10-CM | POA: Diagnosis not present

## 2019-09-26 ENCOUNTER — Other Ambulatory Visit: Payer: Self-pay

## 2019-12-12 DIAGNOSIS — M25562 Pain in left knee: Secondary | ICD-10-CM | POA: Diagnosis not present

## 2019-12-12 DIAGNOSIS — Z888 Allergy status to other drugs, medicaments and biological substances status: Secondary | ICD-10-CM | POA: Diagnosis not present

## 2019-12-12 DIAGNOSIS — Z791 Long term (current) use of non-steroidal anti-inflammatories (NSAID): Secondary | ICD-10-CM | POA: Diagnosis not present

## 2019-12-12 DIAGNOSIS — M79605 Pain in left leg: Secondary | ICD-10-CM | POA: Diagnosis not present

## 2019-12-12 DIAGNOSIS — Z881 Allergy status to other antibiotic agents status: Secondary | ICD-10-CM | POA: Diagnosis not present

## 2019-12-12 DIAGNOSIS — Z87891 Personal history of nicotine dependence: Secondary | ICD-10-CM | POA: Diagnosis not present

## 2019-12-12 DIAGNOSIS — M1990 Unspecified osteoarthritis, unspecified site: Secondary | ICD-10-CM | POA: Diagnosis not present

## 2019-12-12 DIAGNOSIS — J449 Chronic obstructive pulmonary disease, unspecified: Secondary | ICD-10-CM | POA: Diagnosis not present

## 2019-12-12 DIAGNOSIS — M10062 Idiopathic gout, left knee: Secondary | ICD-10-CM | POA: Diagnosis not present

## 2019-12-12 DIAGNOSIS — Z950 Presence of cardiac pacemaker: Secondary | ICD-10-CM | POA: Diagnosis not present

## 2019-12-12 DIAGNOSIS — Z7951 Long term (current) use of inhaled steroids: Secondary | ICD-10-CM | POA: Diagnosis not present

## 2019-12-12 DIAGNOSIS — Z79899 Other long term (current) drug therapy: Secondary | ICD-10-CM | POA: Diagnosis not present

## 2019-12-19 DIAGNOSIS — I429 Cardiomyopathy, unspecified: Secondary | ICD-10-CM | POA: Diagnosis not present

## 2019-12-19 DIAGNOSIS — I442 Atrioventricular block, complete: Secondary | ICD-10-CM | POA: Diagnosis not present

## 2019-12-19 DIAGNOSIS — Z95810 Presence of automatic (implantable) cardiac defibrillator: Secondary | ICD-10-CM | POA: Diagnosis not present

## 2019-12-19 DIAGNOSIS — I426 Alcoholic cardiomyopathy: Secondary | ICD-10-CM | POA: Diagnosis not present

## 2019-12-19 DIAGNOSIS — I208 Other forms of angina pectoris: Secondary | ICD-10-CM | POA: Diagnosis not present

## 2019-12-19 DIAGNOSIS — I472 Ventricular tachycardia: Secondary | ICD-10-CM | POA: Diagnosis not present

## 2019-12-25 DIAGNOSIS — I088 Other rheumatic multiple valve diseases: Secondary | ICD-10-CM | POA: Diagnosis not present

## 2019-12-25 DIAGNOSIS — I517 Cardiomegaly: Secondary | ICD-10-CM | POA: Diagnosis not present

## 2019-12-31 DIAGNOSIS — Z95810 Presence of automatic (implantable) cardiac defibrillator: Secondary | ICD-10-CM | POA: Diagnosis not present

## 2019-12-31 DIAGNOSIS — Z7951 Long term (current) use of inhaled steroids: Secondary | ICD-10-CM | POA: Diagnosis not present

## 2019-12-31 DIAGNOSIS — I426 Alcoholic cardiomyopathy: Secondary | ICD-10-CM | POA: Diagnosis present

## 2019-12-31 DIAGNOSIS — Z87891 Personal history of nicotine dependence: Secondary | ICD-10-CM | POA: Diagnosis not present

## 2019-12-31 DIAGNOSIS — I517 Cardiomegaly: Secondary | ICD-10-CM | POA: Diagnosis not present

## 2019-12-31 DIAGNOSIS — M1990 Unspecified osteoarthritis, unspecified site: Secondary | ICD-10-CM | POA: Diagnosis present

## 2019-12-31 DIAGNOSIS — Z01818 Encounter for other preprocedural examination: Secondary | ICD-10-CM | POA: Diagnosis not present

## 2019-12-31 DIAGNOSIS — Z951 Presence of aortocoronary bypass graft: Secondary | ICD-10-CM | POA: Diagnosis not present

## 2019-12-31 DIAGNOSIS — Z20822 Contact with and (suspected) exposure to covid-19: Secondary | ICD-10-CM | POA: Diagnosis present

## 2019-12-31 DIAGNOSIS — R0989 Other specified symptoms and signs involving the circulatory and respiratory systems: Secondary | ICD-10-CM | POA: Diagnosis not present

## 2019-12-31 DIAGNOSIS — Z79899 Other long term (current) drug therapy: Secondary | ICD-10-CM | POA: Diagnosis not present

## 2019-12-31 DIAGNOSIS — M109 Gout, unspecified: Secondary | ICD-10-CM | POA: Diagnosis present

## 2019-12-31 DIAGNOSIS — I472 Ventricular tachycardia: Secondary | ICD-10-CM | POA: Diagnosis present

## 2019-12-31 DIAGNOSIS — I25118 Atherosclerotic heart disease of native coronary artery with other forms of angina pectoris: Secondary | ICD-10-CM | POA: Diagnosis present

## 2019-12-31 DIAGNOSIS — J984 Other disorders of lung: Secondary | ICD-10-CM | POA: Diagnosis not present

## 2019-12-31 DIAGNOSIS — I2510 Atherosclerotic heart disease of native coronary artery without angina pectoris: Secondary | ICD-10-CM | POA: Diagnosis not present

## 2019-12-31 DIAGNOSIS — J9811 Atelectasis: Secondary | ICD-10-CM | POA: Diagnosis not present

## 2019-12-31 DIAGNOSIS — I442 Atrioventricular block, complete: Secondary | ICD-10-CM | POA: Diagnosis present

## 2019-12-31 DIAGNOSIS — R079 Chest pain, unspecified: Secondary | ICD-10-CM | POA: Diagnosis not present

## 2019-12-31 DIAGNOSIS — I4891 Unspecified atrial fibrillation: Secondary | ICD-10-CM | POA: Diagnosis not present

## 2019-12-31 DIAGNOSIS — E785 Hyperlipidemia, unspecified: Secondary | ICD-10-CM | POA: Diagnosis present

## 2019-12-31 DIAGNOSIS — R918 Other nonspecific abnormal finding of lung field: Secondary | ICD-10-CM | POA: Diagnosis not present

## 2019-12-31 DIAGNOSIS — R739 Hyperglycemia, unspecified: Secondary | ICD-10-CM | POA: Diagnosis present

## 2019-12-31 DIAGNOSIS — I340 Nonrheumatic mitral (valve) insufficiency: Secondary | ICD-10-CM | POA: Diagnosis not present

## 2019-12-31 DIAGNOSIS — Z950 Presence of cardiac pacemaker: Secondary | ICD-10-CM | POA: Diagnosis not present

## 2019-12-31 DIAGNOSIS — J90 Pleural effusion, not elsewhere classified: Secondary | ICD-10-CM | POA: Diagnosis not present

## 2019-12-31 DIAGNOSIS — I255 Ischemic cardiomyopathy: Secondary | ICD-10-CM | POA: Diagnosis present

## 2019-12-31 DIAGNOSIS — Z881 Allergy status to other antibiotic agents status: Secondary | ICD-10-CM | POA: Diagnosis not present

## 2019-12-31 DIAGNOSIS — I6523 Occlusion and stenosis of bilateral carotid arteries: Secondary | ICD-10-CM | POA: Diagnosis not present

## 2019-12-31 DIAGNOSIS — I208 Other forms of angina pectoris: Secondary | ICD-10-CM | POA: Diagnosis not present

## 2019-12-31 DIAGNOSIS — I5022 Chronic systolic (congestive) heart failure: Secondary | ICD-10-CM | POA: Diagnosis present

## 2019-12-31 DIAGNOSIS — I110 Hypertensive heart disease with heart failure: Secondary | ICD-10-CM | POA: Diagnosis present

## 2019-12-31 DIAGNOSIS — J939 Pneumothorax, unspecified: Secondary | ICD-10-CM | POA: Diagnosis not present

## 2019-12-31 DIAGNOSIS — Z888 Allergy status to other drugs, medicaments and biological substances status: Secondary | ICD-10-CM | POA: Diagnosis not present

## 2019-12-31 DIAGNOSIS — Z8659 Personal history of other mental and behavioral disorders: Secondary | ICD-10-CM | POA: Diagnosis not present

## 2019-12-31 DIAGNOSIS — Z8674 Personal history of sudden cardiac arrest: Secondary | ICD-10-CM | POA: Diagnosis not present

## 2019-12-31 DIAGNOSIS — J449 Chronic obstructive pulmonary disease, unspecified: Secondary | ICD-10-CM | POA: Diagnosis present

## 2020-01-01 DIAGNOSIS — Z01818 Encounter for other preprocedural examination: Secondary | ICD-10-CM | POA: Diagnosis not present

## 2020-01-01 DIAGNOSIS — I6523 Occlusion and stenosis of bilateral carotid arteries: Secondary | ICD-10-CM | POA: Diagnosis not present

## 2020-01-01 DIAGNOSIS — I208 Other forms of angina pectoris: Secondary | ICD-10-CM | POA: Diagnosis not present

## 2020-01-02 DIAGNOSIS — I2510 Atherosclerotic heart disease of native coronary artery without angina pectoris: Secondary | ICD-10-CM | POA: Diagnosis not present

## 2020-01-02 DIAGNOSIS — J90 Pleural effusion, not elsewhere classified: Secondary | ICD-10-CM | POA: Diagnosis not present

## 2020-01-02 DIAGNOSIS — I472 Ventricular tachycardia: Secondary | ICD-10-CM | POA: Diagnosis present

## 2020-01-02 DIAGNOSIS — E785 Hyperlipidemia, unspecified: Secondary | ICD-10-CM | POA: Diagnosis present

## 2020-01-02 DIAGNOSIS — Z95810 Presence of automatic (implantable) cardiac defibrillator: Secondary | ICD-10-CM | POA: Diagnosis not present

## 2020-01-02 DIAGNOSIS — Z87891 Personal history of nicotine dependence: Secondary | ICD-10-CM | POA: Diagnosis not present

## 2020-01-02 DIAGNOSIS — I426 Alcoholic cardiomyopathy: Secondary | ICD-10-CM | POA: Diagnosis present

## 2020-01-02 DIAGNOSIS — I208 Other forms of angina pectoris: Secondary | ICD-10-CM | POA: Diagnosis not present

## 2020-01-02 DIAGNOSIS — Z20822 Contact with and (suspected) exposure to covid-19: Secondary | ICD-10-CM | POA: Diagnosis present

## 2020-01-02 DIAGNOSIS — Z79899 Other long term (current) drug therapy: Secondary | ICD-10-CM | POA: Diagnosis not present

## 2020-01-02 DIAGNOSIS — Z7951 Long term (current) use of inhaled steroids: Secondary | ICD-10-CM | POA: Diagnosis not present

## 2020-01-02 DIAGNOSIS — I517 Cardiomegaly: Secondary | ICD-10-CM | POA: Diagnosis not present

## 2020-01-02 DIAGNOSIS — J984 Other disorders of lung: Secondary | ICD-10-CM | POA: Diagnosis not present

## 2020-01-02 DIAGNOSIS — Z881 Allergy status to other antibiotic agents status: Secondary | ICD-10-CM | POA: Diagnosis not present

## 2020-01-02 DIAGNOSIS — M1990 Unspecified osteoarthritis, unspecified site: Secondary | ICD-10-CM | POA: Diagnosis present

## 2020-01-02 DIAGNOSIS — Z8674 Personal history of sudden cardiac arrest: Secondary | ICD-10-CM | POA: Diagnosis not present

## 2020-01-02 DIAGNOSIS — R739 Hyperglycemia, unspecified: Secondary | ICD-10-CM | POA: Diagnosis present

## 2020-01-02 DIAGNOSIS — R918 Other nonspecific abnormal finding of lung field: Secondary | ICD-10-CM | POA: Diagnosis not present

## 2020-01-02 DIAGNOSIS — R079 Chest pain, unspecified: Secondary | ICD-10-CM | POA: Diagnosis not present

## 2020-01-02 DIAGNOSIS — M109 Gout, unspecified: Secondary | ICD-10-CM | POA: Diagnosis present

## 2020-01-02 DIAGNOSIS — I5022 Chronic systolic (congestive) heart failure: Secondary | ICD-10-CM | POA: Diagnosis present

## 2020-01-02 DIAGNOSIS — J939 Pneumothorax, unspecified: Secondary | ICD-10-CM | POA: Diagnosis not present

## 2020-01-02 DIAGNOSIS — I4891 Unspecified atrial fibrillation: Secondary | ICD-10-CM | POA: Diagnosis not present

## 2020-01-02 DIAGNOSIS — I25118 Atherosclerotic heart disease of native coronary artery with other forms of angina pectoris: Secondary | ICD-10-CM | POA: Diagnosis present

## 2020-01-02 DIAGNOSIS — Z888 Allergy status to other drugs, medicaments and biological substances status: Secondary | ICD-10-CM | POA: Diagnosis not present

## 2020-01-02 DIAGNOSIS — R0989 Other specified symptoms and signs involving the circulatory and respiratory systems: Secondary | ICD-10-CM | POA: Diagnosis not present

## 2020-01-02 DIAGNOSIS — Z950 Presence of cardiac pacemaker: Secondary | ICD-10-CM | POA: Diagnosis not present

## 2020-01-02 DIAGNOSIS — I442 Atrioventricular block, complete: Secondary | ICD-10-CM | POA: Diagnosis present

## 2020-01-02 DIAGNOSIS — Z951 Presence of aortocoronary bypass graft: Secondary | ICD-10-CM | POA: Diagnosis not present

## 2020-01-02 DIAGNOSIS — Z8659 Personal history of other mental and behavioral disorders: Secondary | ICD-10-CM | POA: Diagnosis not present

## 2020-01-02 DIAGNOSIS — J449 Chronic obstructive pulmonary disease, unspecified: Secondary | ICD-10-CM | POA: Diagnosis present

## 2020-01-02 DIAGNOSIS — J9811 Atelectasis: Secondary | ICD-10-CM | POA: Diagnosis not present

## 2020-01-02 DIAGNOSIS — I255 Ischemic cardiomyopathy: Secondary | ICD-10-CM | POA: Diagnosis present

## 2020-01-02 DIAGNOSIS — I110 Hypertensive heart disease with heart failure: Secondary | ICD-10-CM | POA: Diagnosis present

## 2020-01-21 DIAGNOSIS — J449 Chronic obstructive pulmonary disease, unspecified: Secondary | ICD-10-CM | POA: Diagnosis not present

## 2020-01-21 DIAGNOSIS — M109 Gout, unspecified: Secondary | ICD-10-CM | POA: Diagnosis not present

## 2020-01-21 DIAGNOSIS — L409 Psoriasis, unspecified: Secondary | ICD-10-CM | POA: Diagnosis not present

## 2020-01-21 DIAGNOSIS — E785 Hyperlipidemia, unspecified: Secondary | ICD-10-CM | POA: Diagnosis not present

## 2020-01-22 DIAGNOSIS — I2510 Atherosclerotic heart disease of native coronary artery without angina pectoris: Secondary | ICD-10-CM | POA: Diagnosis not present

## 2020-01-22 DIAGNOSIS — J9811 Atelectasis: Secondary | ICD-10-CM | POA: Diagnosis not present

## 2020-01-22 DIAGNOSIS — Z951 Presence of aortocoronary bypass graft: Secondary | ICD-10-CM | POA: Diagnosis not present

## 2020-01-22 DIAGNOSIS — Z950 Presence of cardiac pacemaker: Secondary | ICD-10-CM | POA: Diagnosis not present

## 2020-01-22 DIAGNOSIS — J90 Pleural effusion, not elsewhere classified: Secondary | ICD-10-CM | POA: Diagnosis not present

## 2020-01-24 DIAGNOSIS — Z951 Presence of aortocoronary bypass graft: Secondary | ICD-10-CM | POA: Diagnosis not present

## 2020-02-05 DIAGNOSIS — I429 Cardiomyopathy, unspecified: Secondary | ICD-10-CM | POA: Diagnosis not present

## 2020-02-05 DIAGNOSIS — I472 Ventricular tachycardia: Secondary | ICD-10-CM | POA: Diagnosis not present

## 2020-02-15 DIAGNOSIS — I083 Combined rheumatic disorders of mitral, aortic and tricuspid valves: Secondary | ICD-10-CM | POA: Diagnosis not present

## 2020-02-22 DIAGNOSIS — R0600 Dyspnea, unspecified: Secondary | ICD-10-CM | POA: Diagnosis not present

## 2020-02-22 DIAGNOSIS — J449 Chronic obstructive pulmonary disease, unspecified: Secondary | ICD-10-CM | POA: Diagnosis not present

## 2020-02-22 DIAGNOSIS — J3089 Other allergic rhinitis: Secondary | ICD-10-CM | POA: Diagnosis not present

## 2020-02-27 DIAGNOSIS — I2510 Atherosclerotic heart disease of native coronary artery without angina pectoris: Secondary | ICD-10-CM | POA: Diagnosis not present

## 2020-02-27 DIAGNOSIS — I472 Ventricular tachycardia: Secondary | ICD-10-CM | POA: Diagnosis not present

## 2020-02-27 DIAGNOSIS — I10 Essential (primary) hypertension: Secondary | ICD-10-CM | POA: Diagnosis not present

## 2020-02-27 DIAGNOSIS — Z95810 Presence of automatic (implantable) cardiac defibrillator: Secondary | ICD-10-CM | POA: Diagnosis not present

## 2020-02-27 DIAGNOSIS — I426 Alcoholic cardiomyopathy: Secondary | ICD-10-CM | POA: Diagnosis not present

## 2020-03-19 DIAGNOSIS — I429 Cardiomyopathy, unspecified: Secondary | ICD-10-CM | POA: Diagnosis not present

## 2020-04-16 DIAGNOSIS — Z5189 Encounter for other specified aftercare: Secondary | ICD-10-CM | POA: Diagnosis not present

## 2020-04-16 DIAGNOSIS — Z4802 Encounter for removal of sutures: Secondary | ICD-10-CM | POA: Diagnosis not present

## 2020-04-17 DIAGNOSIS — Z951 Presence of aortocoronary bypass graft: Secondary | ICD-10-CM | POA: Diagnosis not present

## 2020-04-17 DIAGNOSIS — S51812A Laceration without foreign body of left forearm, initial encounter: Secondary | ICD-10-CM | POA: Diagnosis not present

## 2020-04-17 DIAGNOSIS — G8911 Acute pain due to trauma: Secondary | ICD-10-CM | POA: Diagnosis not present

## 2020-04-17 DIAGNOSIS — J449 Chronic obstructive pulmonary disease, unspecified: Secondary | ICD-10-CM | POA: Diagnosis not present

## 2020-04-17 DIAGNOSIS — Z7951 Long term (current) use of inhaled steroids: Secondary | ICD-10-CM | POA: Diagnosis not present

## 2020-04-17 DIAGNOSIS — Z888 Allergy status to other drugs, medicaments and biological substances status: Secondary | ICD-10-CM | POA: Diagnosis not present

## 2020-04-17 DIAGNOSIS — Z79899 Other long term (current) drug therapy: Secondary | ICD-10-CM | POA: Diagnosis not present

## 2020-04-17 DIAGNOSIS — Z7982 Long term (current) use of aspirin: Secondary | ICD-10-CM | POA: Diagnosis not present

## 2020-04-17 DIAGNOSIS — Z950 Presence of cardiac pacemaker: Secondary | ICD-10-CM | POA: Diagnosis not present

## 2020-04-17 DIAGNOSIS — M1991 Primary osteoarthritis, unspecified site: Secondary | ICD-10-CM | POA: Diagnosis not present

## 2020-04-17 DIAGNOSIS — Z87891 Personal history of nicotine dependence: Secondary | ICD-10-CM | POA: Diagnosis not present

## 2020-05-01 DIAGNOSIS — Z4802 Encounter for removal of sutures: Secondary | ICD-10-CM | POA: Diagnosis not present

## 2020-05-01 DIAGNOSIS — Z5189 Encounter for other specified aftercare: Secondary | ICD-10-CM | POA: Diagnosis not present

## 2020-05-07 DIAGNOSIS — E785 Hyperlipidemia, unspecified: Secondary | ICD-10-CM | POA: Diagnosis not present

## 2020-05-07 DIAGNOSIS — I426 Alcoholic cardiomyopathy: Secondary | ICD-10-CM | POA: Diagnosis not present

## 2020-05-07 DIAGNOSIS — Z95810 Presence of automatic (implantable) cardiac defibrillator: Secondary | ICD-10-CM | POA: Diagnosis not present

## 2020-05-07 DIAGNOSIS — I10 Essential (primary) hypertension: Secondary | ICD-10-CM | POA: Diagnosis not present

## 2020-05-07 DIAGNOSIS — I2510 Atherosclerotic heart disease of native coronary artery without angina pectoris: Secondary | ICD-10-CM | POA: Diagnosis not present

## 2020-05-07 DIAGNOSIS — I472 Ventricular tachycardia: Secondary | ICD-10-CM | POA: Diagnosis not present

## 2020-05-07 DIAGNOSIS — I5022 Chronic systolic (congestive) heart failure: Secondary | ICD-10-CM | POA: Diagnosis not present

## 2020-05-13 DIAGNOSIS — E785 Hyperlipidemia, unspecified: Secondary | ICD-10-CM | POA: Diagnosis not present

## 2020-05-30 DIAGNOSIS — E785 Hyperlipidemia, unspecified: Secondary | ICD-10-CM | POA: Diagnosis not present

## 2020-05-30 DIAGNOSIS — Z79899 Other long term (current) drug therapy: Secondary | ICD-10-CM | POA: Diagnosis not present

## 2020-06-18 DIAGNOSIS — I429 Cardiomyopathy, unspecified: Secondary | ICD-10-CM | POA: Diagnosis not present

## 2020-07-23 DIAGNOSIS — I10 Essential (primary) hypertension: Secondary | ICD-10-CM | POA: Diagnosis not present

## 2020-07-23 DIAGNOSIS — J449 Chronic obstructive pulmonary disease, unspecified: Secondary | ICD-10-CM | POA: Diagnosis not present

## 2020-07-23 DIAGNOSIS — R945 Abnormal results of liver function studies: Secondary | ICD-10-CM | POA: Diagnosis not present

## 2020-07-23 DIAGNOSIS — N401 Enlarged prostate with lower urinary tract symptoms: Secondary | ICD-10-CM | POA: Diagnosis not present

## 2020-07-23 DIAGNOSIS — Z0000 Encounter for general adult medical examination without abnormal findings: Secondary | ICD-10-CM | POA: Diagnosis not present

## 2020-07-23 DIAGNOSIS — E785 Hyperlipidemia, unspecified: Secondary | ICD-10-CM | POA: Diagnosis not present

## 2020-07-23 DIAGNOSIS — R635 Abnormal weight gain: Secondary | ICD-10-CM | POA: Diagnosis not present

## 2020-07-23 DIAGNOSIS — D649 Anemia, unspecified: Secondary | ICD-10-CM | POA: Diagnosis not present

## 2020-07-23 DIAGNOSIS — Z125 Encounter for screening for malignant neoplasm of prostate: Secondary | ICD-10-CM | POA: Diagnosis not present

## 2020-07-23 DIAGNOSIS — M109 Gout, unspecified: Secondary | ICD-10-CM | POA: Diagnosis not present

## 2020-07-23 DIAGNOSIS — R5383 Other fatigue: Secondary | ICD-10-CM | POA: Diagnosis not present

## 2020-08-01 DIAGNOSIS — Z79899 Other long term (current) drug therapy: Secondary | ICD-10-CM | POA: Diagnosis not present

## 2020-08-01 DIAGNOSIS — E785 Hyperlipidemia, unspecified: Secondary | ICD-10-CM | POA: Diagnosis not present

## 2020-08-25 DIAGNOSIS — I509 Heart failure, unspecified: Secondary | ICD-10-CM | POA: Diagnosis not present

## 2020-08-25 DIAGNOSIS — J3089 Other allergic rhinitis: Secondary | ICD-10-CM | POA: Diagnosis not present

## 2020-08-25 DIAGNOSIS — J449 Chronic obstructive pulmonary disease, unspecified: Secondary | ICD-10-CM | POA: Diagnosis not present

## 2020-09-10 DIAGNOSIS — I442 Atrioventricular block, complete: Secondary | ICD-10-CM | POA: Diagnosis not present

## 2020-09-10 DIAGNOSIS — Z95810 Presence of automatic (implantable) cardiac defibrillator: Secondary | ICD-10-CM | POA: Diagnosis not present

## 2020-09-10 DIAGNOSIS — I429 Cardiomyopathy, unspecified: Secondary | ICD-10-CM | POA: Diagnosis not present

## 2020-09-10 DIAGNOSIS — I472 Ventricular tachycardia: Secondary | ICD-10-CM | POA: Diagnosis not present

## 2020-09-10 DIAGNOSIS — I2510 Atherosclerotic heart disease of native coronary artery without angina pectoris: Secondary | ICD-10-CM | POA: Diagnosis not present

## 2020-11-25 DIAGNOSIS — R0600 Dyspnea, unspecified: Secondary | ICD-10-CM | POA: Diagnosis not present

## 2020-11-25 DIAGNOSIS — J449 Chronic obstructive pulmonary disease, unspecified: Secondary | ICD-10-CM | POA: Diagnosis not present

## 2020-11-25 DIAGNOSIS — J3089 Other allergic rhinitis: Secondary | ICD-10-CM | POA: Diagnosis not present

## 2020-12-18 DIAGNOSIS — I5022 Chronic systolic (congestive) heart failure: Secondary | ICD-10-CM | POA: Diagnosis not present

## 2020-12-18 DIAGNOSIS — I426 Alcoholic cardiomyopathy: Secondary | ICD-10-CM | POA: Diagnosis not present

## 2020-12-18 DIAGNOSIS — I472 Ventricular tachycardia: Secondary | ICD-10-CM | POA: Diagnosis not present

## 2020-12-18 DIAGNOSIS — Z95810 Presence of automatic (implantable) cardiac defibrillator: Secondary | ICD-10-CM | POA: Diagnosis not present

## 2020-12-18 DIAGNOSIS — I2510 Atherosclerotic heart disease of native coronary artery without angina pectoris: Secondary | ICD-10-CM | POA: Diagnosis not present

## 2020-12-24 DIAGNOSIS — I429 Cardiomyopathy, unspecified: Secondary | ICD-10-CM | POA: Diagnosis not present

## 2021-01-16 DIAGNOSIS — R945 Abnormal results of liver function studies: Secondary | ICD-10-CM | POA: Diagnosis not present

## 2021-01-16 DIAGNOSIS — I10 Essential (primary) hypertension: Secondary | ICD-10-CM | POA: Diagnosis not present

## 2021-01-16 DIAGNOSIS — J449 Chronic obstructive pulmonary disease, unspecified: Secondary | ICD-10-CM | POA: Diagnosis not present

## 2021-01-16 DIAGNOSIS — E785 Hyperlipidemia, unspecified: Secondary | ICD-10-CM | POA: Diagnosis not present

## 2021-01-16 DIAGNOSIS — M109 Gout, unspecified: Secondary | ICD-10-CM | POA: Diagnosis not present

## 2021-01-16 DIAGNOSIS — R300 Dysuria: Secondary | ICD-10-CM | POA: Diagnosis not present

## 2021-02-07 DIAGNOSIS — F17290 Nicotine dependence, other tobacco product, uncomplicated: Secondary | ICD-10-CM | POA: Diagnosis not present

## 2021-02-07 DIAGNOSIS — Z7982 Long term (current) use of aspirin: Secondary | ICD-10-CM | POA: Diagnosis not present

## 2021-02-07 DIAGNOSIS — Z888 Allergy status to other drugs, medicaments and biological substances status: Secondary | ICD-10-CM | POA: Diagnosis not present

## 2021-02-07 DIAGNOSIS — Z7951 Long term (current) use of inhaled steroids: Secondary | ICD-10-CM | POA: Diagnosis not present

## 2021-02-07 DIAGNOSIS — T40715A Adverse effect of cannabis, initial encounter: Secondary | ICD-10-CM | POA: Diagnosis not present

## 2021-02-07 DIAGNOSIS — R9431 Abnormal electrocardiogram [ECG] [EKG]: Secondary | ICD-10-CM | POA: Diagnosis not present

## 2021-02-07 DIAGNOSIS — J449 Chronic obstructive pulmonary disease, unspecified: Secondary | ICD-10-CM | POA: Diagnosis not present

## 2021-02-07 DIAGNOSIS — Z881 Allergy status to other antibiotic agents status: Secondary | ICD-10-CM | POA: Diagnosis not present

## 2021-02-07 DIAGNOSIS — F419 Anxiety disorder, unspecified: Secondary | ICD-10-CM | POA: Diagnosis not present

## 2021-02-07 DIAGNOSIS — Z79899 Other long term (current) drug therapy: Secondary | ICD-10-CM | POA: Diagnosis not present

## 2021-02-07 DIAGNOSIS — R531 Weakness: Secondary | ICD-10-CM | POA: Diagnosis not present

## 2021-02-07 DIAGNOSIS — Z951 Presence of aortocoronary bypass graft: Secondary | ICD-10-CM | POA: Diagnosis not present

## 2021-02-07 DIAGNOSIS — Z950 Presence of cardiac pacemaker: Secondary | ICD-10-CM | POA: Diagnosis not present

## 2021-02-07 DIAGNOSIS — Z8674 Personal history of sudden cardiac arrest: Secondary | ICD-10-CM | POA: Diagnosis not present

## 2021-02-07 DIAGNOSIS — M1990 Unspecified osteoarthritis, unspecified site: Secondary | ICD-10-CM | POA: Diagnosis not present

## 2021-02-07 DIAGNOSIS — T40725A Adverse effect of synthetic cannabinoids, initial encounter: Secondary | ICD-10-CM | POA: Diagnosis not present

## 2021-02-07 DIAGNOSIS — R42 Dizziness and giddiness: Secondary | ICD-10-CM | POA: Diagnosis not present

## 2021-02-23 ENCOUNTER — Other Ambulatory Visit: Payer: Self-pay

## 2021-02-23 ENCOUNTER — Emergency Department (INDEPENDENT_AMBULATORY_CARE_PROVIDER_SITE_OTHER)
Admission: EM | Admit: 2021-02-23 | Discharge: 2021-02-23 | Disposition: A | Discharge status: Home / Self Care | Payer: Medicare Other | Source: Home / Self Care

## 2021-02-23 DIAGNOSIS — J069 Acute upper respiratory infection, unspecified: Secondary | ICD-10-CM | POA: Diagnosis not present

## 2021-02-23 DIAGNOSIS — R509 Fever, unspecified: Secondary | ICD-10-CM | POA: Diagnosis not present

## 2021-02-23 NOTE — ED Triage Notes (Signed)
Patient presents to Urgent Care with complaints of generalized body aches, productive cough, and fatigue, low grade fever since about 2 days ago. Patient reports he has been taking tylenol for his symptoms, states the highest his temp was 101, intermittent headache as well, relieved by tylenol.

## 2021-02-23 NOTE — Discharge Instructions (Addendum)
Push fluids and get plenty of rest  May take Flonase as needed for symptomatic relief  Your COVID and Influenza tests are pending.  You should self quarantine until the test results are back.    Take Tylenol or ibuprofen as needed for fever or discomfort.  Rest and keep yourself hydrated.    Follow-up with your primary care provider if your symptoms are not improving.    Follow-up in the ER for any shortness of breath, high fever, trouble swallowing, trouble breathing, other concerning symptoms

## 2021-02-23 NOTE — ED Provider Notes (Signed)
[facility]    CSN: [id number] Arrival date & time: 02/23/21  0955      History   Chief Complaint Chief Complaint  Patient presents with  . Generalized Body Aches         HPI [name] is a 70 y.o. male.   Reports body aches, productive cough, fatigue, fever for the last 2 days.  Temperature was 101 at the highest, headache as well.  States that he has been taking Tylenol with some relief of fever and headache.  Denies known sick contacts.  Has negative history of COVID.  Has not received COVID vaccines.  Has not received flu vaccine this year.  Denies abdominal pain, nausea, vomiting, diarrhea, rash, other symptoms.  ROS per HPI   The history is provided by the patient.    Past Medical History:  Diagnosis Date  . CHF (congestive heart failure) ([place])   . COPD (chronic obstructive pulmonary disease) ([place])   . Psoriasis     There are no problems to display for this patient.   Past Surgical History:  Procedure Laterality Date  . EYE SURGERY    . HERNIA REPAIR    . PACEMAKER IMPLANT         Home Medications    Prior to Admission medications   Medication Sig Start Date End Date Taking? Authorizing Provider  fluticasone furoate-vilanterol (BREO ELLIPTA) 200-25 MCG/INH AEPB Inhale 1 puff into the lungs daily.   Yes [provider]  metoprolol succinate (TOPROL-XL) 25 MG 24 hr tablet Take 25 mg by mouth daily.   Yes [provider]  ALLOPURINOL PO Take by mouth.    [provider]  Calcipotriene-Betameth Diprop (ENSTILAR) 0.005-0.064 % FOAM Apply topically.    [provider]  carvedilol (COREG) 25 MG tablet Take 25 mg by mouth 2 (two) times daily with a meal.    [provider]  colchicine (COLCRYS) 0.6 MG tablet Take 0.6 mg by mouth daily.    [provider]  Fluticasone-Salmeterol (ADVAIR) 250-50 MCG/DOSE AEPB Inhale 1 puff into the lungs 2 (two) times daily.    [provider]   predniSONE (STERAPRED UNI-PAK 21 TAB) 10 MG (21) TBPK tablet Take 4 a day for 3 days, 3 a day for 3 days, 2 a day for 3 days, 1 a day for 3 days 07/15/18   Darlyne Russian, MD  tiotropium (SPIRIVA) 18 MCG inhalation capsule Place 18 mcg into inhaler and inhale daily.    [provider]  triamcinolone (KENALOG) 0.025 % ointment Apply 1 application topically 2 (two) times daily.    [provider]    Family History Family History  Problem Relation Age of Onset  . Dementia Mother   . Cancer Father     Social History Social History   Tobacco Use  . Smoking status: Former Research scientist (life sciences)  . Smokeless tobacco: Current User  Substance Use Topics  . Alcohol use: Never     Allergies   Cephalosporins and Formaldehyde   Review of Systems Review of Systems   Physical Exam Triage Vital Signs ED Triage Vitals  Enc Vitals Group     BP 02/23/21 1012 133/87     Pulse Rate 02/23/21 1012 90     Resp 02/23/21 1012 16     Temp 02/23/21 1012 98.2 F (36.8 C)     Temp Source 02/23/21 1012 Oral     SpO2 02/23/21 1012 99 %     Weight --  Height --      Head Circumference --      Peak Flow --      Pain Score 02/23/21 1009 3     Pain Loc --      Pain Edu? --      Excl. in [place]? --    No data found.  Updated Vital Signs BP 133/87 (BP Location: Right Arm)   Pulse 90   Temp 98.2 F (36.8 C) (Oral)   Resp 16   SpO2 99%   Visual Acuity Right Eye Distance:   Left Eye Distance:   Bilateral Distance:    Right Eye Near:   Left Eye Near:    Bilateral Near:     Physical Exam Vitals and nursing note reviewed.  Constitutional:      General: He is not in acute distress.    Appearance: Normal appearance. He is well-developed. He is not ill-appearing.  HENT:     Head: Normocephalic and atraumatic.     Right Ear: Tympanic membrane, ear canal and external ear normal.     Left Ear: Tympanic membrane, ear canal and external ear normal.     Nose: Congestion and rhinorrhea  present.     Mouth/Throat:     Mouth: Mucous membranes are moist.     Pharynx: Posterior oropharyngeal erythema (Cobblestoning present) present.  Eyes:     Extraocular Movements: Extraocular movements intact.     Conjunctiva/sclera: Conjunctivae normal.     Pupils: Pupils are equal, round, and reactive to light.  Cardiovascular:     Rate and Rhythm: Normal rate and regular rhythm.     Heart sounds: No murmur heard.   Pulmonary:     Effort: Pulmonary effort is normal. No respiratory distress.     Breath sounds: No stridor. No wheezing, rhonchi or rales.  Chest:     Chest wall: No tenderness.  Abdominal:     General: Bowel sounds are normal.     Palpations: Abdomen is soft.     Tenderness: There is no abdominal tenderness.  Musculoskeletal:        General: Normal range of motion.     Cervical back: Normal range of motion and neck supple.  Skin:    General: Skin is warm and dry.     Capillary Refill: Capillary refill takes less than 2 seconds.  Neurological:     General: No focal deficit present.     Mental Status: He is alert and oriented to person, place, and time.  Psychiatric:        Mood and Affect: Mood normal.        Behavior: Behavior normal.        Thought Content: Thought content normal.      UC Treatments / Results  Labs (all labs ordered are listed, but only abnormal results are displayed) Labs Reviewed  COVID-19, FLU A+B NAA    EKG   Radiology No results found.  Procedures Procedures (including critical care time)  Medications Ordered in UC Medications - No data to display  Initial Impression / Assessment and Plan / UC Course  I have reviewed the triage vital signs and the nursing notes.  Pertinent labs & imaging results that were available during my care of the patient were reviewed by me and considered in my medical decision making (see chart for details).    Viral URI Fever  Covid and flu swab obtained in office today.   Patient  instructed to quarantine until results are  back and negative.   If flu results are positive, will send Tamiflu when results are received Continue to push fluids and get plenty of rest May use Tylenol as needed for fever, aches, headache May use Flonase as needed  If results are negative, patient may resume daily schedule as tolerated once they are fever free for 24 hours without the use of antipyretic medications.   If results are positive, patient instructed to quarantine for at least 5 days from symptom onset.  If after 5 days symptoms have resolved, may return to work with a well fitting mask for the next 5 days. If symptomatic after day 5, isolation should be extended to 10 days. Patient instructed to follow-up with primary care or with this office as needed.   Patient instructed to follow-up in the ER for trouble swallowing, trouble breathing, other concerning symptoms.    Final Clinical Impressions(s) / UC Diagnoses   Final diagnoses:  Fever, unspecified fever cause  Viral URI     Discharge Instructions     Push fluids and get plenty of rest  May take Flonase as needed for symptomatic relief  Your COVID and Influenza tests are pending.  You should self quarantine until the test results are back.    Take Tylenol or ibuprofen as needed for fever or discomfort.  Rest and keep yourself hydrated.    Follow-up with your primary care provider if your symptoms are not improving.    Follow-up in the ER for any shortness of breath, high fever, trouble swallowing, trouble breathing, other concerning symptoms      ED Prescriptions    None     PDMP not reviewed this encounter.   Faustino Congress, NP 02/23/21 1021

## 2021-02-25 LAB — COVID-19, FLU A+B NAA
Influenza A, NAA: NOT DETECTED
Influenza B, NAA: NOT DETECTED
SARS-CoV-2, NAA: DETECTED — AB

## 2021-03-05 DIAGNOSIS — Z8616 Personal history of COVID-19: Secondary | ICD-10-CM | POA: Diagnosis not present

## 2021-03-05 DIAGNOSIS — J329 Chronic sinusitis, unspecified: Secondary | ICD-10-CM | POA: Diagnosis not present

## 2021-03-05 DIAGNOSIS — J069 Acute upper respiratory infection, unspecified: Secondary | ICD-10-CM | POA: Diagnosis not present

## 2021-03-24 DIAGNOSIS — J449 Chronic obstructive pulmonary disease, unspecified: Secondary | ICD-10-CM | POA: Diagnosis not present

## 2021-03-24 DIAGNOSIS — J3089 Other allergic rhinitis: Secondary | ICD-10-CM | POA: Diagnosis not present

## 2021-03-25 DIAGNOSIS — I429 Cardiomyopathy, unspecified: Secondary | ICD-10-CM | POA: Diagnosis not present

## 2021-06-03 DIAGNOSIS — I442 Atrioventricular block, complete: Secondary | ICD-10-CM | POA: Diagnosis not present

## 2021-06-03 DIAGNOSIS — Z95810 Presence of automatic (implantable) cardiac defibrillator: Secondary | ICD-10-CM | POA: Diagnosis not present

## 2021-06-03 DIAGNOSIS — I472 Ventricular tachycardia: Secondary | ICD-10-CM | POA: Diagnosis not present

## 2021-06-03 DIAGNOSIS — I429 Cardiomyopathy, unspecified: Secondary | ICD-10-CM | POA: Diagnosis not present

## 2021-06-16 DIAGNOSIS — I429 Cardiomyopathy, unspecified: Secondary | ICD-10-CM | POA: Diagnosis not present

## 2021-06-16 DIAGNOSIS — I442 Atrioventricular block, complete: Secondary | ICD-10-CM | POA: Diagnosis not present

## 2021-06-16 DIAGNOSIS — Z95810 Presence of automatic (implantable) cardiac defibrillator: Secondary | ICD-10-CM | POA: Diagnosis not present

## 2021-06-16 DIAGNOSIS — I472 Ventricular tachycardia: Secondary | ICD-10-CM | POA: Diagnosis not present

## 2021-06-20 DIAGNOSIS — I10 Essential (primary) hypertension: Secondary | ICD-10-CM | POA: Diagnosis not present

## 2021-06-20 DIAGNOSIS — M109 Gout, unspecified: Secondary | ICD-10-CM | POA: Diagnosis not present

## 2021-06-20 DIAGNOSIS — J449 Chronic obstructive pulmonary disease, unspecified: Secondary | ICD-10-CM | POA: Diagnosis not present

## 2021-06-20 DIAGNOSIS — E785 Hyperlipidemia, unspecified: Secondary | ICD-10-CM | POA: Diagnosis not present

## 2021-06-20 DIAGNOSIS — Z0000 Encounter for general adult medical examination without abnormal findings: Secondary | ICD-10-CM | POA: Diagnosis not present

## 2021-06-24 DIAGNOSIS — I429 Cardiomyopathy, unspecified: Secondary | ICD-10-CM | POA: Diagnosis not present

## 2021-07-01 DIAGNOSIS — Z79899 Other long term (current) drug therapy: Secondary | ICD-10-CM | POA: Diagnosis not present

## 2021-07-07 DIAGNOSIS — E785 Hyperlipidemia, unspecified: Secondary | ICD-10-CM | POA: Diagnosis not present

## 2021-07-07 DIAGNOSIS — Z95810 Presence of automatic (implantable) cardiac defibrillator: Secondary | ICD-10-CM | POA: Diagnosis not present

## 2021-07-07 DIAGNOSIS — I10 Essential (primary) hypertension: Secondary | ICD-10-CM | POA: Diagnosis not present

## 2021-07-07 DIAGNOSIS — I5022 Chronic systolic (congestive) heart failure: Secondary | ICD-10-CM | POA: Diagnosis not present

## 2021-07-07 DIAGNOSIS — I2510 Atherosclerotic heart disease of native coronary artery without angina pectoris: Secondary | ICD-10-CM | POA: Diagnosis not present

## 2021-07-20 DIAGNOSIS — R739 Hyperglycemia, unspecified: Secondary | ICD-10-CM | POA: Diagnosis not present

## 2021-07-20 DIAGNOSIS — E785 Hyperlipidemia, unspecified: Secondary | ICD-10-CM | POA: Diagnosis not present

## 2021-07-20 DIAGNOSIS — R945 Abnormal results of liver function studies: Secondary | ICD-10-CM | POA: Diagnosis not present

## 2021-07-20 DIAGNOSIS — I10 Essential (primary) hypertension: Secondary | ICD-10-CM | POA: Diagnosis not present

## 2021-07-20 DIAGNOSIS — M109 Gout, unspecified: Secondary | ICD-10-CM | POA: Diagnosis not present

## 2021-07-20 DIAGNOSIS — Z0000 Encounter for general adult medical examination without abnormal findings: Secondary | ICD-10-CM | POA: Diagnosis not present

## 2021-07-20 DIAGNOSIS — J449 Chronic obstructive pulmonary disease, unspecified: Secondary | ICD-10-CM | POA: Diagnosis not present

## 2021-07-20 DIAGNOSIS — Z125 Encounter for screening for malignant neoplasm of prostate: Secondary | ICD-10-CM | POA: Diagnosis not present

## 2021-07-20 DIAGNOSIS — R5383 Other fatigue: Secondary | ICD-10-CM | POA: Diagnosis not present

## 2021-08-18 DIAGNOSIS — I426 Alcoholic cardiomyopathy: Secondary | ICD-10-CM | POA: Diagnosis not present

## 2021-08-18 DIAGNOSIS — Z95810 Presence of automatic (implantable) cardiac defibrillator: Secondary | ICD-10-CM | POA: Diagnosis not present

## 2021-08-18 DIAGNOSIS — I2510 Atherosclerotic heart disease of native coronary artery without angina pectoris: Secondary | ICD-10-CM | POA: Diagnosis not present

## 2021-08-18 DIAGNOSIS — I442 Atrioventricular block, complete: Secondary | ICD-10-CM | POA: Diagnosis not present

## 2021-08-18 DIAGNOSIS — I4729 Other ventricular tachycardia: Secondary | ICD-10-CM | POA: Diagnosis not present

## 2021-08-18 DIAGNOSIS — I10 Essential (primary) hypertension: Secondary | ICD-10-CM | POA: Diagnosis not present

## 2021-08-18 DIAGNOSIS — I5022 Chronic systolic (congestive) heart failure: Secondary | ICD-10-CM | POA: Diagnosis not present

## 2021-08-25 DIAGNOSIS — I5022 Chronic systolic (congestive) heart failure: Secondary | ICD-10-CM | POA: Diagnosis not present

## 2021-09-23 DIAGNOSIS — I429 Cardiomyopathy, unspecified: Secondary | ICD-10-CM | POA: Diagnosis not present

## 2021-09-28 DIAGNOSIS — J449 Chronic obstructive pulmonary disease, unspecified: Secondary | ICD-10-CM | POA: Diagnosis not present

## 2021-09-28 DIAGNOSIS — I509 Heart failure, unspecified: Secondary | ICD-10-CM | POA: Diagnosis not present

## 2021-09-28 DIAGNOSIS — J3089 Other allergic rhinitis: Secondary | ICD-10-CM | POA: Diagnosis not present

## 2021-11-11 DIAGNOSIS — M109 Gout, unspecified: Secondary | ICD-10-CM | POA: Diagnosis not present

## 2021-11-11 DIAGNOSIS — J449 Chronic obstructive pulmonary disease, unspecified: Secondary | ICD-10-CM | POA: Diagnosis not present

## 2021-11-11 DIAGNOSIS — R739 Hyperglycemia, unspecified: Secondary | ICD-10-CM | POA: Diagnosis not present

## 2021-11-11 DIAGNOSIS — E785 Hyperlipidemia, unspecified: Secondary | ICD-10-CM | POA: Diagnosis not present

## 2021-12-02 DIAGNOSIS — I5022 Chronic systolic (congestive) heart failure: Secondary | ICD-10-CM | POA: Diagnosis not present

## 2021-12-02 DIAGNOSIS — Z95810 Presence of automatic (implantable) cardiac defibrillator: Secondary | ICD-10-CM | POA: Diagnosis not present

## 2021-12-02 DIAGNOSIS — I2510 Atherosclerotic heart disease of native coronary artery without angina pectoris: Secondary | ICD-10-CM | POA: Diagnosis not present

## 2021-12-02 DIAGNOSIS — I4729 Other ventricular tachycardia: Secondary | ICD-10-CM | POA: Diagnosis not present

## 2021-12-02 DIAGNOSIS — I429 Cardiomyopathy, unspecified: Secondary | ICD-10-CM | POA: Diagnosis not present

## 2021-12-02 DIAGNOSIS — I442 Atrioventricular block, complete: Secondary | ICD-10-CM | POA: Diagnosis not present

## 2021-12-23 DIAGNOSIS — I429 Cardiomyopathy, unspecified: Secondary | ICD-10-CM | POA: Diagnosis not present

## 2022-01-04 DIAGNOSIS — J449 Chronic obstructive pulmonary disease, unspecified: Secondary | ICD-10-CM | POA: Diagnosis not present

## 2022-01-04 DIAGNOSIS — M109 Gout, unspecified: Secondary | ICD-10-CM | POA: Diagnosis not present

## 2022-01-04 DIAGNOSIS — E785 Hyperlipidemia, unspecified: Secondary | ICD-10-CM | POA: Diagnosis not present

## 2022-01-04 DIAGNOSIS — I10 Essential (primary) hypertension: Secondary | ICD-10-CM | POA: Diagnosis not present

## 2022-01-04 DIAGNOSIS — R739 Hyperglycemia, unspecified: Secondary | ICD-10-CM | POA: Diagnosis not present

## 2022-01-04 DIAGNOSIS — R945 Abnormal results of liver function studies: Secondary | ICD-10-CM | POA: Diagnosis not present

## 2022-02-24 DIAGNOSIS — E785 Hyperlipidemia, unspecified: Secondary | ICD-10-CM | POA: Diagnosis not present

## 2022-02-24 DIAGNOSIS — I2510 Atherosclerotic heart disease of native coronary artery without angina pectoris: Secondary | ICD-10-CM | POA: Diagnosis not present

## 2022-02-24 DIAGNOSIS — I5022 Chronic systolic (congestive) heart failure: Secondary | ICD-10-CM | POA: Diagnosis not present

## 2022-03-22 DIAGNOSIS — R058 Other specified cough: Secondary | ICD-10-CM | POA: Diagnosis not present

## 2022-03-22 DIAGNOSIS — R0602 Shortness of breath: Secondary | ICD-10-CM | POA: Diagnosis not present

## 2022-03-22 DIAGNOSIS — J449 Chronic obstructive pulmonary disease, unspecified: Secondary | ICD-10-CM | POA: Diagnosis not present

## 2022-03-22 DIAGNOSIS — R062 Wheezing: Secondary | ICD-10-CM | POA: Diagnosis not present

## 2022-04-07 DIAGNOSIS — I429 Cardiomyopathy, unspecified: Secondary | ICD-10-CM | POA: Diagnosis not present

## 2022-04-16 DIAGNOSIS — Z0000 Encounter for general adult medical examination without abnormal findings: Secondary | ICD-10-CM | POA: Diagnosis not present

## 2022-04-16 DIAGNOSIS — I10 Essential (primary) hypertension: Secondary | ICD-10-CM | POA: Diagnosis not present

## 2022-04-16 DIAGNOSIS — E782 Mixed hyperlipidemia: Secondary | ICD-10-CM | POA: Diagnosis not present

## 2022-06-02 DIAGNOSIS — I426 Alcoholic cardiomyopathy: Secondary | ICD-10-CM | POA: Diagnosis not present

## 2022-06-02 DIAGNOSIS — I429 Cardiomyopathy, unspecified: Secondary | ICD-10-CM | POA: Diagnosis not present

## 2022-06-02 DIAGNOSIS — I442 Atrioventricular block, complete: Secondary | ICD-10-CM | POA: Diagnosis not present

## 2022-06-02 DIAGNOSIS — I10 Essential (primary) hypertension: Secondary | ICD-10-CM | POA: Diagnosis not present

## 2022-06-02 DIAGNOSIS — I4729 Other ventricular tachycardia: Secondary | ICD-10-CM | POA: Diagnosis not present

## 2022-06-02 DIAGNOSIS — Z95810 Presence of automatic (implantable) cardiac defibrillator: Secondary | ICD-10-CM | POA: Diagnosis not present

## 2022-06-02 DIAGNOSIS — I5022 Chronic systolic (congestive) heart failure: Secondary | ICD-10-CM | POA: Diagnosis not present

## 2022-06-02 DIAGNOSIS — I2510 Atherosclerotic heart disease of native coronary artery without angina pectoris: Secondary | ICD-10-CM | POA: Diagnosis not present

## 2022-06-28 DIAGNOSIS — J449 Chronic obstructive pulmonary disease, unspecified: Secondary | ICD-10-CM | POA: Diagnosis not present

## 2022-07-30 DIAGNOSIS — Z2821 Immunization not carried out because of patient refusal: Secondary | ICD-10-CM | POA: Diagnosis not present

## 2022-07-30 DIAGNOSIS — J449 Chronic obstructive pulmonary disease, unspecified: Secondary | ICD-10-CM | POA: Diagnosis not present

## 2022-08-09 ENCOUNTER — Telehealth: Payer: Self-pay | Admitting: *Deleted

## 2022-08-09 NOTE — Patient Outreach (Signed)
  Care Coordination   08/09/2022 Name: Brent Mendez MRN: [id number] DOB: 12/30/1950   Care Coordination Outreach Attempts:  An unsuccessful telephone outreach was attempted today to offer the patient information about available care coordination services as a benefit of their health plan.   Follow Up Plan:  Additional outreach attempts will be made to offer the patient care coordination information and services.   Encounter Outcome:  No Answer  Care Coordination Interventions Activated:  No   Care Coordination Interventions:  No, not indicated    Jacqlyn Larsen [facility], [place] RN Care Coordinator [phone]

## 2022-08-10 ENCOUNTER — Telehealth: Payer: Self-pay | Admitting: *Deleted

## 2022-08-10 NOTE — Patient Outreach (Signed)
  Care Coordination   08/10/2022 Name: Brent Mendez MRN: [id number] DOB: 05-17-1951   Care Coordination Outreach Attempts:  A second unsuccessful outreach was attempted today to offer the patient with information about available care coordination services as a benefit of their health plan.     Follow Up Plan:  Additional outreach attempts will be made to offer the patient care coordination information and services.   Encounter Outcome:  No Answer  Care Coordination Interventions Activated:  No   Care Coordination Interventions:  No, not indicated    Jacqlyn Larsen [facility], [place] RN Care Coordinator [phone]

## 2022-08-11 ENCOUNTER — Telehealth: Payer: Self-pay | Admitting: *Deleted

## 2022-08-11 NOTE — Patient Outreach (Signed)
  Care Coordination   08/11/2022 Name: Brent Mendez MRN: [id number] DOB: [birth year]   Care Coordination Outreach Attempts:  A third unsuccessful outreach was attempted today to offer the patient with information about available care coordination services as a benefit of their health plan.   Follow Up Plan:  No further outreach attempts will be made at this time. We have been unable to contact the patient to offer or enroll patient in care coordination services  Encounter Outcome:  No Answer  Care Coordination Interventions Activated:  No   Care Coordination Interventions:  No, not indicated    Jacqlyn Larsen [facility], BSN [facility] RN Care Coordinator [phone]

## 2022-09-15 DIAGNOSIS — I429 Cardiomyopathy, unspecified: Secondary | ICD-10-CM | POA: Diagnosis not present

## 2022-09-17 DIAGNOSIS — R7309 Other abnormal glucose: Secondary | ICD-10-CM | POA: Diagnosis not present

## 2022-09-17 DIAGNOSIS — I10 Essential (primary) hypertension: Secondary | ICD-10-CM | POA: Diagnosis not present

## 2022-09-17 DIAGNOSIS — Z0000 Encounter for general adult medical examination without abnormal findings: Secondary | ICD-10-CM | POA: Diagnosis not present

## 2022-10-29 DIAGNOSIS — Z87891 Personal history of nicotine dependence: Secondary | ICD-10-CM | POA: Diagnosis not present

## 2022-10-29 DIAGNOSIS — J449 Chronic obstructive pulmonary disease, unspecified: Secondary | ICD-10-CM | POA: Diagnosis not present

## 2022-10-29 DIAGNOSIS — Z2821 Immunization not carried out because of patient refusal: Secondary | ICD-10-CM | POA: Diagnosis not present
